# Patient Record
Sex: Male | Born: 1955 | Race: White | Hispanic: No | Marital: Single | Smoking: Former smoker
Health system: Southern US, Community
[De-identification: ages and names within clinical notes are randomized; demographics above are authoritative.]

## PROBLEM LIST (undated history)

## (undated) DIAGNOSIS — D751 Secondary polycythemia: Secondary | ICD-10-CM

## (undated) DIAGNOSIS — I2510 Atherosclerotic heart disease of native coronary artery without angina pectoris: Secondary | ICD-10-CM

## (undated) DIAGNOSIS — R7989 Other specified abnormal findings of blood chemistry: Secondary | ICD-10-CM

## (undated) DIAGNOSIS — E785 Hyperlipidemia, unspecified: Secondary | ICD-10-CM

## (undated) DIAGNOSIS — Z720 Tobacco use: Secondary | ICD-10-CM

## (undated) DIAGNOSIS — I255 Ischemic cardiomyopathy: Secondary | ICD-10-CM

## (undated) DIAGNOSIS — I5022 Chronic systolic (congestive) heart failure: Secondary | ICD-10-CM

## (undated) DIAGNOSIS — R945 Abnormal results of liver function studies: Secondary | ICD-10-CM

## (undated) DIAGNOSIS — I2111 ST elevation (STEMI) myocardial infarction involving right coronary artery: Secondary | ICD-10-CM

## (undated) HISTORY — PX: APPENDECTOMY: SHX54

## (undated) HISTORY — DX: Ischemic cardiomyopathy: I25.5

---

## 2015-02-27 ENCOUNTER — Emergency Department (HOSPITAL_COMMUNITY)
Admission: EM | Admit: 2015-02-27 | Discharge: 2015-02-27 | Disposition: A | Discharge status: Home / Self Care | Payer: Self-pay | Attending: Emergency Medicine | Admitting: Emergency Medicine

## 2015-02-27 ENCOUNTER — Encounter (HOSPITAL_COMMUNITY): Payer: Self-pay | Admitting: Family Medicine

## 2015-02-27 ENCOUNTER — Emergency Department (HOSPITAL_COMMUNITY): Payer: Self-pay

## 2015-02-27 DIAGNOSIS — Y9389 Activity, other specified: Secondary | ICD-10-CM | POA: Insufficient documentation

## 2015-02-27 DIAGNOSIS — Y998 Other external cause status: Secondary | ICD-10-CM | POA: Insufficient documentation

## 2015-02-27 DIAGNOSIS — S60222A Contusion of left hand, initial encounter: Secondary | ICD-10-CM | POA: Insufficient documentation

## 2015-02-27 DIAGNOSIS — Z720 Tobacco use: Secondary | ICD-10-CM | POA: Insufficient documentation

## 2015-02-27 DIAGNOSIS — Y92410 Unspecified street and highway as the place of occurrence of the external cause: Secondary | ICD-10-CM | POA: Insufficient documentation

## 2015-02-27 MED ORDER — NAPROXEN 500 MG PO TABS: 500.0000 mg | ORAL_TABLET | Freq: Two times a day (BID) | ORAL | Status: DC

## 2015-02-27 NOTE — Discharge Instructions (Signed)
Naprosyn for pain. Keep hand elevated. Ice several times a day. Continue ACE wrap for swelling and pain. Follow up with your doctor as needed.   Hand Contusion A hand contusion is a deep bruise on your hand area. Contusions are the result of an injury that caused bleeding under the skin. The contusion may turn blue, purple, or yellow. Minor injuries will give you a painless contusion, but more severe contusions may stay painful and swollen for a few weeks. CAUSES  A contusion is usually caused by a blow, trauma, or direct force to an area of the body. SYMPTOMS   Swelling and redness of the injured area.  Discoloration of the injured area.  Tenderness and soreness of the injured area.  Pain. DIAGNOSIS  The diagnosis can be made by taking a history and performing a physical exam. An X-ray, CT scan, or MRI may be needed to determine if there were any associated injuries, such as broken bones (fractures). TREATMENT  Often, the best treatment for a hand contusion is resting, elevating, icing, and applying cold compresses to the injured area. Over-the-counter medicines may also be recommended for pain control. HOME CARE INSTRUCTIONS   Put ice on the injured area.  Put ice in a plastic bag.  Place a towel between your skin and the bag.  Leave the ice on for 15-20 minutes, 03-04 times a day.  Only take over-the-counter or prescription medicines as directed by your caregiver. Your caregiver may recommend avoiding anti-inflammatory medicines (aspirin, ibuprofen, and naproxen) for 48 hours because these medicines may increase bruising.  If told, use an elastic wrap as directed. This can help reduce swelling. You may remove the wrap for sleeping, showering, and bathing. If your fingers become numb, cold, or blue, take the wrap off and reapply it more loosely.  Elevate your hand with pillows to reduce swelling.  Avoid overusing your hand if it is painful. SEEK IMMEDIATE MEDICAL CARE IF:    You have increased redness, swelling, or pain in your hand.  Your swelling or pain is not relieved with medicines.  You have loss of feeling in your hand or are unable to move your fingers.  Your hand turns cold or blue.  You have pain when you move your fingers.  Your hand becomes warm to the touch.  Your contusion does not improve in 2 days. MAKE SURE YOU:   Understand these instructions.  Will watch your condition.  Will get help right away if you are not doing well or get worse. Document Released: 01/11/2002 Document Revised: 04/15/2012 Document Reviewed: 01/13/2012 [facility] Patient Information 2015 [place], [place]. This information is not intended to replace advice given to you by your health care provider. Make sure you discuss any questions you have with your health care provider.
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
## 2015-07-05 ENCOUNTER — Encounter (HOSPITAL_COMMUNITY): Payer: Self-pay | Admitting: Neurology

## 2015-07-05 ENCOUNTER — Inpatient Hospital Stay (HOSPITAL_COMMUNITY): Payer: Self-pay

## 2015-07-05 ENCOUNTER — Inpatient Hospital Stay (HOSPITAL_COMMUNITY)
Admission: EM | Admit: 2015-07-05 | Discharge: 2015-07-08 | DRG: 247 | Disposition: A | Discharge status: Home / Self Care | Payer: Self-pay | Attending: Cardiovascular Disease | Admitting: Cardiovascular Disease

## 2015-07-05 ENCOUNTER — Encounter (HOSPITAL_COMMUNITY)
Admission: EM | Disposition: A | Discharge status: Home / Self Care | Payer: Self-pay | Source: Home / Self Care | Attending: Cardiovascular Disease

## 2015-07-05 ENCOUNTER — Emergency Department (HOSPITAL_COMMUNITY): Payer: MEDICAID

## 2015-07-05 DIAGNOSIS — Z955 Presence of coronary angioplasty implant and graft: Secondary | ICD-10-CM

## 2015-07-05 DIAGNOSIS — I25110 Atherosclerotic heart disease of native coronary artery with unstable angina pectoris: Secondary | ICD-10-CM | POA: Diagnosis present

## 2015-07-05 DIAGNOSIS — R7989 Other specified abnormal findings of blood chemistry: Secondary | ICD-10-CM

## 2015-07-05 DIAGNOSIS — E785 Hyperlipidemia, unspecified: Secondary | ICD-10-CM | POA: Diagnosis present

## 2015-07-05 DIAGNOSIS — Z720 Tobacco use: Secondary | ICD-10-CM

## 2015-07-05 DIAGNOSIS — I517 Cardiomegaly: Secondary | ICD-10-CM | POA: Diagnosis present

## 2015-07-05 DIAGNOSIS — I2111 ST elevation (STEMI) myocardial infarction involving right coronary artery: Principal | ICD-10-CM

## 2015-07-05 DIAGNOSIS — R945 Abnormal results of liver function studies: Secondary | ICD-10-CM

## 2015-07-05 DIAGNOSIS — F17210 Nicotine dependence, cigarettes, uncomplicated: Secondary | ICD-10-CM | POA: Diagnosis present

## 2015-07-05 DIAGNOSIS — D751 Secondary polycythemia: Secondary | ICD-10-CM | POA: Diagnosis present

## 2015-07-05 DIAGNOSIS — I213 ST elevation (STEMI) myocardial infarction of unspecified site: Secondary | ICD-10-CM

## 2015-07-05 DIAGNOSIS — I252 Old myocardial infarction: Secondary | ICD-10-CM | POA: Diagnosis present

## 2015-07-05 DIAGNOSIS — I2510 Atherosclerotic heart disease of native coronary artery without angina pectoris: Secondary | ICD-10-CM

## 2015-07-05 DIAGNOSIS — I255 Ischemic cardiomyopathy: Secondary | ICD-10-CM | POA: Diagnosis present

## 2015-07-05 HISTORY — DX: Abnormal results of liver function studies: R94.5

## 2015-07-05 HISTORY — DX: ST elevation (STEMI) myocardial infarction involving right coronary artery: I21.11

## 2015-07-05 HISTORY — DX: Other specified abnormal findings of blood chemistry: R79.89

## 2015-07-05 HISTORY — DX: Chronic systolic (congestive) heart failure: I50.22

## 2015-07-05 HISTORY — DX: Tobacco use: Z72.0

## 2015-07-05 HISTORY — DX: Secondary polycythemia: D75.1

## 2015-07-05 HISTORY — PX: CARDIAC CATHETERIZATION: SHX172

## 2015-07-05 HISTORY — DX: Hyperlipidemia, unspecified: E78.5

## 2015-07-05 HISTORY — DX: Atherosclerotic heart disease of native coronary artery without angina pectoris: I25.10

## 2015-07-05 LAB — BASIC METABOLIC PANEL
Anion gap: 13 (ref 5–15)
BUN: <5 mg/dL — ABNORMAL LOW (ref 6–20)
CHLORIDE: 100 mmol/L — AB (ref 101–111)
CO2: 25 mmol/L (ref 22–32)
CREATININE: 1.31 mg/dL — AB (ref 0.61–1.24)
Calcium: 9.2 mg/dL (ref 8.9–10.3)
GFR, EST NON AFRICAN AMERICAN: 57 mL/min — AB (ref >60)
Glucose, Bld: 187 mg/dL — ABNORMAL HIGH (ref 65–99)
Potassium: 3.8 mmol/L (ref 3.5–5.1)
SODIUM: 138 mmol/L (ref 135–145)

## 2015-07-05 LAB — CBC
HCT: 59.4 % — ABNORMAL HIGH (ref 39.0–52.0)
Hemoglobin: 20.5 g/dL — ABNORMAL HIGH (ref 13.0–17.0)
MCH: 31.3 pg (ref 26.0–34.0)
MCHC: 34.5 g/dL (ref 30.0–36.0)
MCV: 90.5 fL (ref 78.0–100.0)
PLATELETS: 256 10*3/uL (ref 150–400)
RBC: 6.56 MIL/uL — AB (ref 4.22–5.81)
RDW: 13.7 % (ref 11.5–15.5)
WBC: 14.7 10*3/uL — AB (ref 4.0–10.5)

## 2015-07-05 LAB — I-STAT CHEM 8, ED
BUN: 5 mg/dL — ABNORMAL LOW (ref 6–20)
Calcium, Ion: 1.07 mmol/L — ABNORMAL LOW (ref 1.13–1.30)
Chloride: 99 mmol/L — ABNORMAL LOW (ref 101–111)
Creatinine, Ser: 1.2 mg/dL (ref 0.61–1.24)
Glucose, Bld: 185 mg/dL — ABNORMAL HIGH (ref 65–99)
HEMATOCRIT: 63 % — AB (ref 39.0–52.0)
HEMOGLOBIN: 21.4 g/dL — AB (ref 13.0–17.0)
POTASSIUM: 3.7 mmol/L (ref 3.5–5.1)
SODIUM: 139 mmol/L (ref 135–145)
TCO2: 26 mmol/L (ref 0–100)

## 2015-07-05 LAB — I-STAT TROPONIN, ED: TROPONIN I, POC: 1.68 ng/mL — AB (ref 0.00–0.08)

## 2015-07-05 LAB — POCT ACTIVATED CLOTTING TIME: ACTIVATED CLOTTING TIME: 534 s

## 2015-07-05 LAB — TROPONIN I: TROPONIN I: 15.71 ng/mL — AB (ref <0.031)

## 2015-07-05 LAB — MRSA PCR SCREENING: MRSA BY PCR: NEGATIVE

## 2015-07-05 SURGERY — LEFT HEART CATH AND CORONARY ANGIOGRAPHY
Anesthesia: LOCAL

## 2015-07-05 MED ORDER — TICAGRELOR 90 MG PO TABS
ORAL_TABLET | ORAL | Status: DC | PRN
  Administered 2015-07-05: 180 mg via ORAL

## 2015-07-05 MED ORDER — TICAGRELOR 90 MG PO TABS
ORAL_TABLET | ORAL | Status: AC
  Filled 2015-07-05: qty 1

## 2015-07-05 MED ORDER — BIVALIRUDIN BOLUS VIA INFUSION - CUPID
INTRAVENOUS | Status: DC | PRN
  Administered 2015-07-05: 41.25 mg via INTRAVENOUS

## 2015-07-05 MED ORDER — SODIUM CHLORIDE 0.9 % IV SOLN: 1.7500 mg/kg/h | INTRAVENOUS | Status: DC

## 2015-07-05 MED ORDER — FUROSEMIDE 10 MG/ML IJ SOLN
INTRAMUSCULAR | Status: AC
  Filled 2015-07-05: qty 4

## 2015-07-05 MED ORDER — MIDAZOLAM HCL 2 MG/2ML IJ SOLN
INTRAMUSCULAR | Status: DC | PRN
  Administered 2015-07-05: 2 mg via INTRAVENOUS

## 2015-07-05 MED ORDER — HYDRALAZINE HCL 20 MG/ML IJ SOLN
INTRAMUSCULAR | Status: AC
  Filled 2015-07-05: qty 1

## 2015-07-05 MED ORDER — ASPIRIN 81 MG PO CHEW
324.0000 mg | CHEWABLE_TABLET | Freq: Once | ORAL | Status: AC
  Administered 2015-07-05: 324 mg via ORAL
  Filled 2015-07-05: qty 4

## 2015-07-05 MED ORDER — SODIUM CHLORIDE 0.9 % IV SOLN: 250.0000 mL | INTRAVENOUS | Status: DC | PRN

## 2015-07-05 MED ORDER — IOHEXOL 350 MG/ML SOLN
INTRAVENOUS | Status: DC | PRN
  Administered 2015-07-05: 185 mL via INTRA_ARTICULAR

## 2015-07-05 MED ORDER — BIVALIRUDIN 250 MG IV SOLR
INTRAVENOUS | Status: AC
  Filled 2015-07-05: qty 250

## 2015-07-05 MED ORDER — MORPHINE SULFATE (PF) 4 MG/ML IV SOLN
4.0000 mg | Freq: Once | INTRAVENOUS | Status: AC
  Administered 2015-07-05: 4 mg via INTRAVENOUS
  Filled 2015-07-05: qty 1

## 2015-07-05 MED ORDER — DOPAMINE-DEXTROSE 3.2-5 MG/ML-% IV SOLN: 3.0000 ug/kg/min | INTRAVENOUS | Status: DC

## 2015-07-05 MED ORDER — ONDANSETRON HCL 4 MG/2ML IJ SOLN: 4.0000 mg | Freq: Four times a day (QID) | INTRAMUSCULAR | Status: DC | PRN

## 2015-07-05 MED ORDER — FUROSEMIDE 10 MG/ML IJ SOLN
INTRAMUSCULAR | Status: DC | PRN
  Administered 2015-07-05: 20 mg via INTRAVENOUS

## 2015-07-05 MED ORDER — SODIUM CHLORIDE 0.9 % IV SOLN
1.7500 mg/kg/h | INTRAVENOUS | Status: AC
  Administered 2015-07-05: 1.75 mg/kg/h via INTRAVENOUS
  Filled 2015-07-05 (×2): qty 250

## 2015-07-05 MED ORDER — TICAGRELOR 90 MG PO TABS
90.0000 mg | ORAL_TABLET | Freq: Two times a day (BID) | ORAL | Status: DC
  Administered 2015-07-05 – 2015-07-08 (×6): 90 mg via ORAL
  Filled 2015-07-05 (×7): qty 1

## 2015-07-05 MED ORDER — LIDOCAINE HCL (PF) 1 % IJ SOLN
INTRAMUSCULAR | Status: AC
  Filled 2015-07-05: qty 30

## 2015-07-05 MED ORDER — LIDOCAINE HCL (PF) 1 % IJ SOLN
INTRAMUSCULAR | Status: DC | PRN
  Administered 2015-07-05: 15 mL

## 2015-07-05 MED ORDER — NITROGLYCERIN 1 MG/10 ML FOR IR/CATH LAB
INTRA_ARTERIAL | Status: AC
  Filled 2015-07-05: qty 10

## 2015-07-05 MED ORDER — FENTANYL CITRATE (PF) 100 MCG/2ML IJ SOLN
INTRAMUSCULAR | Status: DC | PRN
  Administered 2015-07-05: 50 ug via INTRAVENOUS

## 2015-07-05 MED ORDER — SODIUM CHLORIDE 0.9 % IV SOLN
INTRAVENOUS | Status: DC
  Administered 2015-07-05: 13:00:00 via INTRAVENOUS

## 2015-07-05 MED ORDER — NITROGLYCERIN IN D5W 200-5 MCG/ML-% IV SOLN
INTRAVENOUS | Status: AC
  Filled 2015-07-05: qty 250

## 2015-07-05 MED ORDER — HYDRALAZINE HCL 20 MG/ML IJ SOLN
10.0000 mg | Freq: Four times a day (QID) | INTRAMUSCULAR | Status: DC | PRN
  Administered 2015-07-05: 10 mg via INTRAVENOUS

## 2015-07-05 MED ORDER — ATROPINE SULFATE 0.1 MG/ML IJ SOLN
INTRAMUSCULAR | Status: AC
  Filled 2015-07-05: qty 10

## 2015-07-05 MED ORDER — HEPARIN SODIUM (PORCINE) 5000 UNIT/ML IJ SOLN
3500.0000 [IU] | INTRAMUSCULAR | Status: AC
  Administered 2015-07-05: 3500 [IU] via INTRAVENOUS
  Filled 2015-07-05: qty 1

## 2015-07-05 MED ORDER — MIDAZOLAM HCL 2 MG/2ML IJ SOLN
INTRAMUSCULAR | Status: AC
  Filled 2015-07-05: qty 2

## 2015-07-05 MED ORDER — SODIUM CHLORIDE 0.9 % IJ SOLN
3.0000 mL | Freq: Two times a day (BID) | INTRAMUSCULAR | Status: DC
  Administered 2015-07-06 – 2015-07-08 (×5): 3 mL via INTRAVENOUS

## 2015-07-05 MED ORDER — ASPIRIN EC 81 MG PO TBEC
81.0000 mg | DELAYED_RELEASE_TABLET | Freq: Every day | ORAL | Status: DC
Start: 2015-07-05 — End: 2015-07-08
  Administered 2015-07-06 – 2015-07-08 (×3): 81 mg via ORAL
  Filled 2015-07-05 (×3): qty 1

## 2015-07-05 MED ORDER — DOPAMINE-DEXTROSE 3.2-5 MG/ML-% IV SOLN
INTRAVENOUS | Status: AC
  Filled 2015-07-05: qty 250

## 2015-07-05 MED ORDER — ACETAMINOPHEN 325 MG PO TABS: 650.0000 mg | ORAL_TABLET | ORAL | Status: DC | PRN

## 2015-07-05 MED ORDER — NITROGLYCERIN IN D5W 200-5 MCG/ML-% IV SOLN
0.0000 ug/min | INTRAVENOUS | Status: DC
  Administered 2015-07-05: 30 ug/min via INTRAVENOUS

## 2015-07-05 MED ORDER — FENTANYL CITRATE (PF) 100 MCG/2ML IJ SOLN
INTRAMUSCULAR | Status: AC
  Filled 2015-07-05: qty 2

## 2015-07-05 MED ORDER — HEPARIN (PORCINE) IN NACL 2-0.9 UNIT/ML-% IJ SOLN
INTRAMUSCULAR | Status: AC
  Filled 2015-07-05: qty 1000

## 2015-07-05 MED ORDER — SODIUM CHLORIDE 0.9 % IV SOLN: INTRAVENOUS | Status: DC

## 2015-07-05 MED ORDER — NITROGLYCERIN 1 MG/10 ML FOR IR/CATH LAB
INTRA_ARTERIAL | Status: DC | PRN
  Administered 2015-07-05: 12:00:00

## 2015-07-05 MED ORDER — NITROGLYCERIN IN D5W 200-5 MCG/ML-% IV SOLN
INTRAVENOUS | Status: DC | PRN
  Administered 2015-07-05: 10 ug/min via INTRAVENOUS

## 2015-07-05 MED ORDER — SODIUM CHLORIDE 0.9 % IJ SOLN: 3.0000 mL | INTRAMUSCULAR | Status: DC | PRN

## 2015-07-05 MED ORDER — SODIUM CHLORIDE 0.9 % IV SOLN
250.0000 mg | INTRAVENOUS | Status: DC | PRN
  Administered 2015-07-05: 1.75 mg/kg/h via INTRAVENOUS

## 2015-07-05 MED ORDER — SODIUM CHLORIDE 0.9 % IV SOLN
INTRAVENOUS | Status: DC | PRN
  Administered 2015-07-05: 1000 mL via INTRAVENOUS

## 2015-07-05 SURGICAL SUPPLY — 17 items
BALLN EMERGE MR 2.0X12 (BALLOONS) ×2
BALLN ~~LOC~~ EUPHORA RX 3.25X20 (BALLOONS) ×2
BALLOON EMERGE MR 2.0X12 (BALLOONS) ×1 IMPLANT
BALLOON ~~LOC~~ EUPHORA RX 3.25X20 (BALLOONS) ×1 IMPLANT
CATH INFINITI 5FR MULTPACK ANG (CATHETERS) ×2 IMPLANT
GUIDE CATH RUNWAY 6FR FR4 (CATHETERS) ×2 IMPLANT
KIT ENCORE 26 ADVANTAGE (KITS) ×2 IMPLANT
KIT HEART LEFT (KITS) ×2 IMPLANT
PACK CARDIAC CATHETERIZATION (CUSTOM PROCEDURE TRAY) ×2 IMPLANT
SHEATH PINNACLE 6F 10CM (SHEATH) ×2 IMPLANT
STENT XIENCE ALPINE RX 3.0X38 (Permanent Stent) ×2 IMPLANT
SYR MEDRAD MARK V 150ML (SYRINGE) ×2 IMPLANT
TRANSDUCER W/STOPCOCK (MISCELLANEOUS) ×2 IMPLANT
TUBING CIL FLEX 10 FLL-RA (TUBING) ×2 IMPLANT
WIRE COUGAR XT STRL 190CM (WIRE) ×2 IMPLANT
WIRE EMERALD 3MM-J .035X150CM (WIRE) ×2 IMPLANT
WIRE PT2 MS 185 (WIRE) ×2 IMPLANT

## 2015-07-05 NOTE — Progress Notes (Signed)
Cardiac Cath Sheath Removal Note  Site Area: R Femoral  Site prior to removal: Level 0 Pressure applied for: 23 minutes  Began at: 18:42 to 19:00 Manual pressure  Patient status during sheath pull: vital signs stable Groin site post sheath removal: Level 0  Post sheath removal instructions given: YES  Post pulses present: YES  Pressure dressing applied: YES   Hillery Jacksebecca Micaela Stith, RN

## 2015-07-05 NOTE — ED Provider Notes (Signed)
CSN: [id number]     Arrival date & time 07/05/15  8469 History   First MD Initiated Contact with Patient 07/05/15 1004     Chief Complaint  Patient presents with  . Chest Pain     (Consider location/radiation/quality/duration/timing/severity/associated sxs/prior Treatment) HPI Comments: Patient presents to the emergency department with chief complaint of chest pain. He states that he has had intermittent chest pain 3 days. He states that the pain is in his central chest. Contrary to nursing note, the pain comes and goes. CP this morning started at around 7:30 am. He reports associated chills, but denies any fevers. He states that his symptoms are worsened with exertion.  He denies any history of prior heart problems.  States that he is followed at the [place].  He is uncertain about whether he has HTN or HL.  Denies DM.  Reports that he is an everyday smoker.  The history is provided by the patient. No language interpreter was used.    History reviewed. No pertinent past medical history. Past Surgical History  Procedure Laterality Date  . Appendectomy     No family history on file. Social History  Substance Use Topics  . Smoking status: Current Every Day Smoker  . Smokeless tobacco: None  . Alcohol Use: No    Review of Systems  Constitutional: Negative for fever and chills.  Respiratory: Negative for shortness of breath.   Cardiovascular: Positive for chest pain.  Gastrointestinal: Negative for nausea, vomiting, diarrhea and constipation.  Genitourinary: Negative for dysuria.  All other systems reviewed and are negative.     Allergies  Review of patient's allergies indicates no known allergies.  Home Medications   Prior to Admission medications   Medication Sig Start Date End Date Taking? Authorizing Provider  Phenyleph-Doxylamine-DM-APAP (ALKA-SELTZER PLS NIGHT CLD/FLU PO) Take 1 packet by mouth at bedtime.   Yes Historical Provider, MD  naproxen (NAPROSYN) 500 MG tablet  Take 1 tablet (500 mg total) by mouth 2 (two) times daily. Patient not taking: Reported on 07/05/2015 02/27/15   Tatyana Kirichenko, PA-C   BP 175/107 mmHg  Pulse 88  Temp(Src) 97.5 F (36.4 C) (Oral)  Resp 18  SpO2 98% Physical Exam  Constitutional: He is oriented to person, place, and time. He appears well-developed and well-nourished.  HENT:  Head: Normocephalic and atraumatic.  Eyes: Conjunctivae and EOM are normal. Pupils are equal, round, and reactive to light. Right eye exhibits no discharge. Left eye exhibits no discharge. No scleral icterus.  Neck: Normal range of motion. Neck supple. No JVD present.  Cardiovascular: Normal rate, regular rhythm and normal heart sounds.  Exam reveals no gallop and no friction rub.   No murmur heard. Pulmonary/Chest: Effort normal and breath sounds normal. No respiratory distress. He has no wheezes. He has no rales. He exhibits no tenderness.  Abdominal: Soft. He exhibits no distension and no mass. There is no tenderness. There is no rebound and no guarding.  Musculoskeletal: Normal range of motion. He exhibits no edema or tenderness.  Neurological: He is alert and oriented to person, place, and time.  Skin: Skin is warm and dry.  Psychiatric: He has a normal mood and affect. His behavior is normal. Judgment and thought content normal.  Nursing note and vitals reviewed.   ED Course  Procedures (including critical care time) Results for orders placed or performed during the hospital encounter of 07/05/15  CBC  Result Value Ref Range   WBC 14.7 (H) 4.0 - 10.5 K/uL  RBC 6.56 (H) 4.22 - 5.81 MIL/uL   Hemoglobin 20.5 (H) 13.0 - 17.0 g/dL   HCT 40.959.4 (H) 81.139.0 - 91.452.0 %   MCV 90.5 78.0 - 100.0 fL   MCH 31.3 26.0 - 34.0 pg   MCHC 34.5 30.0 - 36.0 g/dL   RDW 78.213.7 95.611.5 - 21.315.5 %   Platelets 256 150 - 400 K/uL  I-stat troponin, ED (not [facility])  Result Value Ref Range   Troponin i, poc 1.68 (HH) 0.00 - 0.08 ng/mL   Comment NOTIFIED PHYSICIAN     Comment 3          I-stat chem 8, ed  Result Value Ref Range   Sodium 139 135 - 145 mmol/L   Potassium 3.7 3.5 - 5.1 mmol/L   Chloride 99 (L) 101 - 111 mmol/L   BUN 5 (L) 6 - 20 mg/dL   Creatinine, Ser 0.861.20 0.61 - 1.24 mg/dL   Glucose, Bld 578185 (H) 65 - 99 mg/dL   Calcium, Ion 4.691.07 (L) 1.13 - 1.30 mmol/L   TCO2 26 0 - 100 mmol/L   Hemoglobin 21.4 (HH) 13.0 - 17.0 g/dL   HCT 62.963.0 (H) 52.839.0 - 41.352.0 %   Comment NOTIFIED PHYSICIAN    No results found.  I have personally reviewed and evaluated these images and lab results as part of my medical decision-making.   EKG Interpretation   Date/Time:  Wednesday July 05 2015 10:30:11 EST Ventricular Rate:  97 PR Interval:  116 QRS Duration: 103 QT Interval:  377 QTC Calculation: 479 R Axis:   -90 Text Interpretation:  Sinus rhythm Multiple premature complexes, vent &  supraven Borderline short PR interval Probable left atrial enlargement  Inferior infarct, acute (RCA) Anterior infarct, old ** ** ACUTE MI / STEMI  ** ** Confirmed by PICKERING  MD, NATHAN [phone]([id number]) on 07/05/2015 10:41:09 AM      MDM   Final diagnoses:  ST elevation myocardial infarction (STEMI), unspecified artery (HCC)    Initial EKG shows possible arm lead reversal.  Will get repeat.   10:36 AM Repeat EKG shows STEMI. With ST elevation inferiorly and depressions in AVL.  Patient immediately seen by Dr. Rubin PayorPickering, code STEMI activated.  10:40 AM Dr. Rubin PayorPickering discussed patient with STEMI team, who will take the patient to cath.  Patient given aspirin, and heparin prior to leaving ED for cath.  Troponin is 1.68.    CRITICAL CARE Performed by: Roxy HorsemanBROWNING, Hailey Stormer   Total critical care time: 30 minutes  Critical care time was exclusive of separately billable procedures and treating other patients.  Critical care was necessary to treat or prevent imminent or life-threatening deterioration.  Critical care was time spent personally by me on the following  activities: development of treatment plan with patient and/or surrogate as well as nursing, discussions with consultants, evaluation of patient's response to treatment, examination of patient, obtaining history from patient or surrogate, ordering and performing treatments and interventions, ordering and review of laboratory studies, ordering and review of radiographic studies, pulse oximetry and re-evaluation of patient's condition.    Roxy Horsemanobert Genie Wenke, PA-C 07/05/15 1059  Benjiman CoreNathan Pickering, MD 07/06/15 [phone]

## 2015-07-05 NOTE — Care Management Note (Addendum)
Case Management Note  Patient Details  Name: Aleda GranaBilly Schulke MRN: [id number] Date of Birth: 04/09/1956  Subjective/Objective:   Adm w mi                 Action/Plan: lives w wife   Expected Discharge Date:                  Expected Discharge Plan:     In-House Referral:     Discharge planning Services     Post Acute Care Choice:    Choice offered to:     DME Arranged:    DME Agency:     HH Arranged:    HH Agency:     Status of Service:     Medicare Important Message Given:    Date Medicare IM Given:    Medicare IM give by:    Date Additional Medicare IM Given:    Additional Medicare Important Message give by:     If discussed at Long Length of Stay Meetings, dates discussed:     ur review done,chart states goes to [facility] care. Left pt inform on [facility] and [facility]. Left pt brilinta 30day free card. Placed brilinta pt assist form on shadow chart.  Hanley Haysowell, Francesa Eugenio T, RN 07/05/2015, 1:38 PM
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
## 2015-07-05 NOTE — ED Notes (Signed)
Pt reports cp for 3 days. Describes pain as behind his sternum and is constant. Also genaralized body aches for 3 days. Denies n/v/d.

## 2015-07-06 ENCOUNTER — Encounter (HOSPITAL_COMMUNITY): Payer: Self-pay | Admitting: Cardiovascular Disease

## 2015-07-06 ENCOUNTER — Inpatient Hospital Stay (HOSPITAL_COMMUNITY): Payer: Self-pay

## 2015-07-06 DIAGNOSIS — I2510 Atherosclerotic heart disease of native coronary artery without angina pectoris: Secondary | ICD-10-CM

## 2015-07-06 DIAGNOSIS — I25110 Atherosclerotic heart disease of native coronary artery with unstable angina pectoris: Secondary | ICD-10-CM

## 2015-07-06 DIAGNOSIS — Z955 Presence of coronary angioplasty implant and graft: Secondary | ICD-10-CM

## 2015-07-06 HISTORY — DX: Atherosclerotic heart disease of native coronary artery without angina pectoris: I25.10

## 2015-07-06 LAB — CBC
HCT: 55.3 % — ABNORMAL HIGH (ref 39.0–52.0)
HEMOGLOBIN: 19 g/dL — AB (ref 13.0–17.0)
MCH: 30.7 pg (ref 26.0–34.0)
MCHC: 34.4 g/dL (ref 30.0–36.0)
MCV: 89.5 fL (ref 78.0–100.0)
PLATELETS: 212 10*3/uL (ref 150–400)
RBC: 6.18 MIL/uL — ABNORMAL HIGH (ref 4.22–5.81)
RDW: 14 % (ref 11.5–15.5)
WBC: 19.9 10*3/uL — ABNORMAL HIGH (ref 4.0–10.5)

## 2015-07-06 LAB — HEPATIC FUNCTION PANEL
ALBUMIN: 3 g/dL — AB (ref 3.5–5.0)
ALT: 27 U/L (ref 17–63)
AST: 111 U/L — ABNORMAL HIGH (ref 15–41)
Alkaline Phosphatase: 56 U/L (ref 38–126)
BILIRUBIN DIRECT: 0.2 mg/dL (ref 0.1–0.5)
BILIRUBIN INDIRECT: 1.9 mg/dL — AB (ref 0.3–0.9)
BILIRUBIN TOTAL: 2.1 mg/dL — AB (ref 0.3–1.2)
Total Protein: 5.5 g/dL — ABNORMAL LOW (ref 6.5–8.1)

## 2015-07-06 LAB — TROPONIN I
TROPONIN I: 26.15 ng/mL — AB (ref <0.031)
TROPONIN I: 22.2 ng/mL — AB (ref <0.031)
Troponin I: 17.56 ng/mL (ref <0.031)

## 2015-07-06 LAB — BASIC METABOLIC PANEL
Anion gap: 14 (ref 5–15)
BUN: 6 mg/dL (ref 6–20)
CALCIUM: 9 mg/dL (ref 8.9–10.3)
CO2: 21 mmol/L — ABNORMAL LOW (ref 22–32)
CREATININE: 0.97 mg/dL (ref 0.61–1.24)
Chloride: 104 mmol/L (ref 101–111)
GFR calc Af Amer: >60 mL/min (ref >60)
GLUCOSE: 82 mg/dL (ref 65–99)
POTASSIUM: 4.4 mmol/L (ref 3.5–5.1)
SODIUM: 139 mmol/L (ref 135–145)

## 2015-07-06 MED ORDER — PNEUMOCOCCAL VAC POLYVALENT 25 MCG/0.5ML IJ INJ
0.5000 mL | INJECTION | INTRAMUSCULAR | Status: AC
  Administered 2015-07-07: 0.5 mL via INTRAMUSCULAR
  Filled 2015-07-06: qty 0.5

## 2015-07-06 MED ORDER — INFLUENZA VAC SPLIT QUAD 0.5 ML IM SUSY
0.5000 mL | PREFILLED_SYRINGE | INTRAMUSCULAR | Status: AC
  Administered 2015-07-07: 0.5 mL via INTRAMUSCULAR
  Filled 2015-07-06: qty 0.5

## 2015-07-06 MED ORDER — ATORVASTATIN CALCIUM 80 MG PO TABS
80.0000 mg | ORAL_TABLET | Freq: Every day | ORAL | Status: DC
  Administered 2015-07-06 – 2015-07-07 (×2): 80 mg via ORAL
  Filled 2015-07-06 (×2): qty 1

## 2015-07-06 MED ORDER — CARVEDILOL 3.125 MG PO TABS
3.1250 mg | ORAL_TABLET | Freq: Two times a day (BID) | ORAL | Status: DC
  Administered 2015-07-06 – 2015-07-08 (×5): 3.125 mg via ORAL
  Filled 2015-07-06 (×5): qty 1

## 2015-07-06 NOTE — Progress Notes (Signed)
*  PRELIMINARY RESULTS* Echocardiogram 2D Echocardiogram has been performed.  Jeryl Columbialliott, Jontavia Leatherbury 07/06/2015, 11:31 AM

## 2015-07-06 NOTE — Progress Notes (Addendum)
59 y/o man with PMH of long term smoking (no other history b/c lack of medical care) who presented to [facility] ED with Inferior STEMI. Taken for Emergent Cardiac Cath -- 99% dRCA with 100% RPAV -- unable to wire RPAV, stented dRCA into RPDA.  Also noted severe pLAD lesion (with possible Cx lesion) & severe global hypokinesis,EF ~25% on LV Gram.   Subjective:  No further CP or dyspnea - lying in bed. No complaints.  Objective:  Vital Signs in the last 24 hours: Temp:  [97.4 F (36.3 C)-98 F (36.7 C)] 97.8 F (36.6 C) (12/01 0400) Pulse Rate:  [0-123] 104 (12/01 0900) Resp:  [0-31] 7 (12/01 0600) BP: (61-176)/(45-122) 114/84 mmHg (12/01 0900) SpO2:  [0 %-100 %] 96 % (12/01 0600) Arterial Line BP: (63-149)/(43-101) 123/77 mmHg (11/30 1832) Weight:  [122 lb 5.7 oz (55.5 kg)] 122 lb 5.7 oz (55.5 kg) (11/30 1230)  Intake/Output from previous day: 11/30 0701 - 12/01 0700 In: 1640.4 [P.O.:480; I.V.:1160.4] Out: 1175 [Urine:1175] Intake/Output from this shift: Total I/O In: 3 [I.V.:3] Out: -   Physical Exam: General: elderly Caucasian male, resting in bed, NAD HEENT: PERRL, EOMI, no scleral icterus, oropharynx clear Cardiac: RRR, no rubs, murmurs or gallops Pulm: CTAB, no wheezes, rales, or rhonchi Abd: soft, nontender, nondistended, BS present Ext: warm and well perfused, no pedal or tibial edema; R groin cath site stable Neuro: responds to questions appropriately; moving all extremities freely  Lab Results:  Recent Labs  07/05/15 1012 07/05/15 1042 07/06/15 0250  WBC 14.7*  --  19.9*  HGB 20.5* 21.4* 19.0*  PLT 256  --  212    Recent Labs  07/05/15 1012 07/05/15 1042 07/06/15 0250  NA 138 139 139  K 3.8 3.7 4.4  CL 100* 99* 104  CO2 25  --  21*  GLUCOSE 187* 185* 82  BUN <5* 5* 6  CREATININE 1.31* 1.20 0.97    Recent Labs  07/05/15 1340 07/06/15 0835  TROPONINI 15.71* 26.15*   Hepatic Function Panel No results for input(s): PROT, ALBUMIN, AST, ALT,  ALKPHOS, BILITOT, BILIDIR, IBILI in the last 72 hours. No results for input(s): CHOL in the last 72 hours. No results for input(s): PROTIME in the last 72 hours.  Imaging: Imaging results have been reviewed  Cardiac Studies:  Cath 11/30: Conclusion     Ost LAD to Prox LAD lesion, 70% stenosed.  Post Atrio lesion, 100% stenosed. - unable to open.  Dist RCA-1 lesion, 50% stenosed.  Dist RCA-2 lesion, 99% stenosed. Post intervention, there is a 0% residual stenosis.  RPDA lesion, 50% stenosed. Post intervention, there is a 0% residual stenosis.  There is severe left ventricular systolic dysfunction.  Dilated left ventricle with severe global LV dysfunction and an ejection fraction of less than 20%.  Two vessel coronary obstructive disease with 70% smooth tubular eccentric ostial LAD stenosis; normal left circumflex: Artery, and RCA with diffuse distal disease with 50, 99, 50% stenoses with total occlusion of the PLA branch.  Successful PCI to the distal RCA with PTCA/DES stenting with a 3.038 mm Xience Alpine stent postdilated to 3.24 down to 3.14 mm with the entire region of stenoses being reduced to 0%. At the completion of the procedure there was am TIMI 1 flow down the PLA branch. There was brisk TIMI-3 flow down the RCA.  RECOMMENDATION: The patient will be maintained on dual antiplatelet therapy. He will need to be started on the patient, probable aldosterone blockade, carvedilol for his  severe LV dysfunction which is out of proportion to his RCA acute infarct. Angiograms will be reviewed with colleagues for consideration of ostial LAD PCI. Smoking cessation was strongly advised.    Assessment/Plan:  Principal Problem:   ST elevation myocardial infarction involving right coronary artery [facility]) Active Problems:   Atherosclerotic heart disease of native coronary artery with unstable angina pectoris (HCC)   Presence of drug coated stent in right coronary artery  The  patient has severe multivessel disease now status post PCI to the RCA. Unable to cannulate the posterior AV groove vessel, therefore would expect completed infarct in that distribution. Clinically he does not have any additional anginal symptoms. Lipid panel pending, blood pressure was initially elevated post catheterization, but now normal. Was hypotensive initially.  PLAN:  Aspirin plus Brilinta.  Check fasting lipid panel, start Lipitor 80 mg    start low-dose beta blocker: Coreg to 25 twice a day  2-D echocardiogram ordered: Need to determine extent of reduced ejection fraction. If significant elevated, may need to consider LifeVest -- this would also add to importance of complete revascularization.  I reviewed the cardiac catheterization images personally, I have asked the couple other colleagues to review them as well. The LAD lesion does look significant & may indeed require intervention, especially in light of ischemic cardiomyopathy -- I do think that the cardiomyopathy is well out of proportion with his CAD.  At this point, I would prefer to allow him to recover from initial MI. We can get him started on some cardio mapping medications starting with a beta blocker now. Any on his symptoms while he is inpatient (clearly not a fast track discharge), we can consider whether or not needs to be completely revascularized during this hospitalization versus evaluation with Myoview in OP setting once stable post MI.     LOS: 1 day    HARDING, DAVID W 07/06/2015, 10:36 AM

## 2015-07-06 NOTE — Progress Notes (Signed)
CARDIAC REHAB PHASE I   PRE:  Rate/Rhythm: 93 SR  BP:  Supine: 115/72  Sitting:   Standing:    SaO2:   MODE:  Ambulation: 350 ft   POST:  Rate/Rhythm: 107 ST  BP:  Supine:   Sitting: 126/77  Standing:    SaO2: 96%RA 1030-1150 Pt walked 350 ft on RA with steady gait. Tolerated well. No CP. MI education completed with pt and family who voiced understanding. Stressed importance of brilinta with stent. Reviewed MI restrictions, NTG use, heart healthy diet, smoking cessation and gave handout and fake cigarette. Pt does not have a plan right now. Encouraged him to think about it. Discussed ex ed and CRP 2. Will refer to [place]. Pt will need CHF ed if EF low by Echo. We will follow up tomorrow.   Jason Nuttingharlene Rayquon Uselman, RN BSN  07/06/2015 11:44 AM

## 2015-07-07 ENCOUNTER — Encounter (HOSPITAL_COMMUNITY): Payer: Self-pay | Admitting: Cardiology

## 2015-07-07 DIAGNOSIS — E785 Hyperlipidemia, unspecified: Secondary | ICD-10-CM | POA: Diagnosis present

## 2015-07-07 DIAGNOSIS — Z955 Presence of coronary angioplasty implant and graft: Secondary | ICD-10-CM

## 2015-07-07 DIAGNOSIS — I255 Ischemic cardiomyopathy: Secondary | ICD-10-CM | POA: Diagnosis present

## 2015-07-07 HISTORY — PX: TRANSTHORACIC ECHOCARDIOGRAM: SHX275

## 2015-07-07 LAB — CBC
HCT: 50.8 % (ref 39.0–52.0)
Hemoglobin: 17.1 g/dL — ABNORMAL HIGH (ref 13.0–17.0)
MCH: 30.6 pg (ref 26.0–34.0)
MCHC: 33.7 g/dL (ref 30.0–36.0)
MCV: 91 fL (ref 78.0–100.0)
PLATELETS: 194 10*3/uL (ref 150–400)
RBC: 5.58 MIL/uL (ref 4.22–5.81)
RDW: 13.8 % (ref 11.5–15.5)
WBC: 11.3 10*3/uL — ABNORMAL HIGH (ref 4.0–10.5)

## 2015-07-07 LAB — LIPID PANEL
CHOL/HDL RATIO: 5.8 ratio
CHOLESTEROL: 221 mg/dL — AB (ref 0–200)
HDL: 38 mg/dL — ABNORMAL LOW (ref >40)
LDL CALC: 160 mg/dL — AB (ref 0–99)
Triglycerides: 117 mg/dL (ref <150)
VLDL: 23 mg/dL (ref 0–40)

## 2015-07-07 NOTE — Progress Notes (Signed)
59 y/o man with PMH of long term smoking (no other history b/c lack of medical care) who [facility] with Inferior STEMI. Taken for Emergent Cardiac Cath -- 99% dRCA with 100% RPAV -- unable to wire RPAV, stented dRCA into RPDA.  Also noted severe pLAD lesion (with possible Cx lesion) & severe global hypokinesis,EF ~25% on LV Gram.   Subjective:  No further CP or dyspnea - lying in bed. No complaints.  Objective:  Vital Signs in the last 24 hours: Temp:  [97.9 F (36.6 C)-98.9 F (37.2 C)] 98.1 F (36.7 C) (12/02 0724) Pulse Rate:  [85-93] 85 (12/02 0724) Resp:  [16-23] 16 (12/02 0724) BP: (95-125)/(61-81) 111/76 mmHg (12/02 0724) SpO2:  [94 %-96 %] 96 % (12/02 0724)  Intake/Output from previous day: 12/01 0701 - 12/02 0700 In: 1388 [P.O.:935; I.V.:453] Out: -  Intake/Output from this shift: Total I/O In: 240 [P.O.:240] Out: -   Physical Exam: General: elderly Caucasian male, resting in bed, NAD HEENT: PERRL, EOMI, no scleral icterus, oropharynx clear Cardiac: RRR, no rubs, murmurs or gallops Pulm: CTAB, no wheezes, rales, or rhonchi Abd: soft, nontender, nondistended, BS present Ext: warm and well perfused, no pedal or tibial edema; R groin cath site stable Neuro: responds to questions appropriately; moving all extremities freely  Lab Results:  Recent Labs  07/06/15 0250 07/07/15 0332  WBC 19.9* 11.3*  HGB 19.0* 17.1*  PLT 212 194    Recent Labs  07/05/15 1012 07/05/15 1042 07/06/15 0250  NA 138 139 139  K 3.8 3.7 4.4  CL 100* 99* 104  CO2 25  --  21*  GLUCOSE 187* 185* 82  BUN <5* 5* 6  CREATININE 1.31* 1.20 0.97    Recent Labs  07/06/15 1424 07/06/15 2015  TROPONINI 22.20* 17.56*   Hepatic Function Panel  Recent Labs  07/06/15 0250  PROT 5.5*  ALBUMIN 3.0*  AST 111*  ALT 27  ALKPHOS 56  BILITOT 2.1*  BILIDIR 0.2  IBILI 1.9*    Recent Labs  07/07/15 0332  CHOL 221*   Lab Results  Component Value Date   CHOL 221*  07/07/2015   HDL 38* 07/07/2015   LDLCALC 160* 07/07/2015   TRIG 117 07/07/2015   CHOLHDL 5.8 07/07/2015    No results for input(s): PROTIME in the last 72 hours.  Imaging: Imaging results have been reviewed  Cardiac Studies:  Cath 11/30: Conclusion     Ost LAD to Prox LAD lesion, 70% stenosed.  Post Atrio lesion, 100% stenosed. - unable to open.  Dist RCA-1 lesion, 50% stenosed.  Dist RCA-2 lesion, 99% stenosed. Post intervention, there is a 0% residual stenosis.  RPDA lesion, 50% stenosed. Post intervention, there is a 0% residual stenosis.  There is severe left ventricular systolic dysfunction.  Dilated left ventricle with severe global LV dysfunction and an ejection fraction of less than 20%.  Two vessel coronary obstructive disease with 70% smooth tubular eccentric ostial LAD stenosis; normal left circumflex: Artery, and RCA with diffuse distal disease with 50, 99, 50% stenoses with total occlusion of the PLA branch.  Successful PCI to the distal RCA with PTCA/DES stenting with a 3.038 mm Xience Alpine stent postdilated to 3.24 down to 3.14 mm with the entire region of stenoses being reduced to 0%. At the completion of the procedure there was am TIMI 1 flow down the PLA branch. There was brisk TIMI-3 flow down the RCA.  RECOMMENDATION: The patient will be maintained on dual  antiplatelet therapy. He will need to be started on the patient, probable aldosterone blockade, carvedilol for his severe LV dysfunction which is out of proportion to his RCA acute infarct. Angiograms will be reviewed with colleagues for consideration of ostial LAD PCI. Smoking cessation was strongly advised.    2D Echo 12/1: Impressions: - Normal LV size with EF 30%. Diffuse hypokinesis with inferior akinesis. Normal RV size and systolic function. No significant valvular abnormalities.  Assessment/Plan:  Principal Problem:   ST elevation myocardial infarction involving right coronary  artery (HCC) Active Problems:   Atherosclerotic heart disease of native coronary artery with unstable angina pectoris (HCC)   Presence of drug coated stent in right coronary artery   Cardiomyopathy, ischemic; EF 30% (out of proportion to CAD)   Hyperlipidemia with target LDL less than 70; Poorly controlled  The patient has severe multivessel disease now status post PCI to the RCA. Unable to cannulate the posterior AV groove vessel, therefore would expect completed infarct in that distribution. Clinically he does not have any additional anginal symptoms.  PLAN:  Aspirin plus Brilinta for ~1 yr DAPT  Poor Lipid control --> started Lipitor 80 mg    Tolerating Coreg to 3.125 twice a day - but BP still is not high enough to uptitrate or add afterload reduction.  2-D echocardiogram with EF ~30% -- will discuss with EP --> may need to consider LifeVest;   Currently no SSx of CHF - consider low dose PO diuretic prior to d/c to avoid CHF complications (would benefit from Aldactone). => would also consider Entresto over ARB if BP will allow (doubt that we can start afterload reduction prior to d/c).  I reviewed the cardiac catheterization images personally, I have asked the couple other colleagues to review them as well. The LAD lesion does look significant & may indeed require intervention, especially in light of ischemic cardiomyopathy; however, we all agree that his cardiomyopathy is well out of proportion with his CAD.    At this point, I would prefer to allow him to recover from initial MI - optimize medical management with BB (+/- ACE-I/ARB if BP will allow - currently BP not high enough to start either).  Clearly not a fast track discharge with reduced LVEF, provided no further angina or significant CHF Sx - would plan to progress from CCU today to Tele since he has not had anginal or CHF Sx & no arrhythmia on tele.    If stable, would probably consider discharge tomorrow. He has not had any  active symptoms. I don't think that he would warrant LifeVest. I've discussed it with other interventional colleagues as well as watch physiology doctor. In the absence of significant arrhythmias or heart failure, no clear data for the use of a LifeVest.  --Transfer to Tele today - expect d/c in AM     LOS: 2 days    HARDING, DAVID W 07/07/2015, 9:09 AM

## 2015-07-07 NOTE — Progress Notes (Signed)
Jason Barry is a 59 y.o. male patient. 1. ST elevation myocardial infarction (STEMI), unspecified artery (HCC)      No Known Allergies  Blood pressure 111/76, pulse 85, temperature 98.1 F (36.7 C), temperature source Oral, resp. rate 16, height 5\' 2"  (1.575 m), weight 55.5 kg (122 lb 5.7 oz), SpO2 96 %.  Subjective No issues over night. Patient had no complaints of pain. Denies CP or discomfort. Has made multiple long laps around unit overnight and today. No SOB. Tolerates walks very well. Ambulated independently.  Objective Patient pleasat, appears comfortable. Assessment benign.   Assessment & Plan Assessment unremarkable. Will continue to encourage activity. Flu and pneumonia vaccines given. Will continue to encourage smoking cessation.Transfere to tele today per MD orders.   Rise PaganiniCURRY, Jason Ashland R 07/07/2015

## 2015-07-07 NOTE — Care Management Note (Addendum)
Case Management Note  Patient Details  Name: Jason Barry MRN: [id number] Date of Birth: 02/21/1956  Subjective/Objective:    Inferior STEMI          Action/Plan:  NCM spoke to pt and wife, Jason Barry at bedside. [facility]. [phone]. Provided pt with Brilinta 30 day free trial card. [facility]. Pt will be able to get his medications from the [facility] once he has established with a PCP.  Pt states he works full-time and plans to apply for his social security when he turns 59 yrs old.  Contacted [facility] and pt does not have a PCP in the system. They will assign him a PCP. He is registered at this [facility] for service.   Expected Discharge Date:  07/09/2015               Expected Discharge Plan:  Home/Self Care  In-House Referral:     Discharge planning Services  CM Consult   Status of Service:  Completed, signed off  Medicare Important Message Given:    Date Medicare IM Given:    Medicare IM give by:    Date Additional Medicare IM Given:    Additional Medicare Important Message give by:     If discussed at Long Length of Stay Meetings, dates discussed:    Additional Comments:  Jason Barry, Jason Kozuch Ellen, RN 07/07/2015, 1:08 PM

## 2015-07-07 NOTE — Progress Notes (Signed)
Transfer from 2heart to floor, oriented to room, call light and phone within reach, denies pain or discomfort.  Raymon MuttonGwen Adaley Kiene RN

## 2015-07-07 NOTE — Progress Notes (Signed)
Pt has been walking independently all day. No SOB or sx. Feels well. Discussed low EF, daily wts, low sodium. Pt voiced understanding. Reiterated need for him to get Brilinta filled. He sts his wife has the small discount card. Pt will need teach back tomorrow. [phone] Ethelda ChickKristan Elisha Cooksey CES, ACSM 3:33 PM 07/07/2015

## 2015-07-08 ENCOUNTER — Encounter (HOSPITAL_COMMUNITY): Payer: Self-pay | Admitting: Physician Assistant

## 2015-07-08 DIAGNOSIS — D751 Secondary polycythemia: Secondary | ICD-10-CM | POA: Diagnosis present

## 2015-07-08 DIAGNOSIS — R7989 Other specified abnormal findings of blood chemistry: Secondary | ICD-10-CM

## 2015-07-08 DIAGNOSIS — Z720 Tobacco use: Secondary | ICD-10-CM

## 2015-07-08 DIAGNOSIS — I5022 Chronic systolic (congestive) heart failure: Secondary | ICD-10-CM | POA: Insufficient documentation

## 2015-07-08 DIAGNOSIS — R945 Abnormal results of liver function studies: Secondary | ICD-10-CM

## 2015-07-08 MED ORDER — ATORVASTATIN CALCIUM 80 MG PO TABS: 80.0000 mg | ORAL_TABLET | Freq: Every evening | ORAL | Status: DC

## 2015-07-08 MED ORDER — NITROGLYCERIN 0.4 MG SL SUBL
0.4000 mg | SUBLINGUAL_TABLET | SUBLINGUAL | Status: DC | PRN
Start: 2015-07-08 — End: 2015-07-19

## 2015-07-08 MED ORDER — ASPIRIN 81 MG PO TBEC: 81.0000 mg | DELAYED_RELEASE_TABLET | Freq: Every day | ORAL | Status: AC

## 2015-07-08 MED ORDER — TICAGRELOR 90 MG PO TABS: 90.0000 mg | ORAL_TABLET | Freq: Two times a day (BID) | ORAL | Status: DC

## 2015-07-08 MED ORDER — LIVING BETTER WITH HEART FAILURE BOOK
Freq: Once | Status: DC
Start: 2015-07-08 — End: 2015-07-08

## 2015-07-08 MED ORDER — CARVEDILOL 3.125 MG PO TABS: 3.1250 mg | ORAL_TABLET | Freq: Two times a day (BID) | ORAL | Status: DC

## 2015-07-08 NOTE — Progress Notes (Signed)
0945 Pt walking independently with steady gait. No CP. Pt able to answer all teachback re CHF ed and NTG use. Will sign off. No other questions per pt. Luetta NuttingCharlene Tawny Raspberry RN BSN 07/08/2015 9:52 AM

## 2015-07-08 NOTE — Progress Notes (Addendum)
Note signed in error. See dc summary.

## 2015-07-08 NOTE — Discharge Instructions (Signed)
Acute Coronary Syndrome °Acute coronary syndrome (ACS) is a serious problem in which there is suddenly not enough blood and oxygen supplied to the heart. ACS may mean that one or more of the blood vessels in your heart (coronary arteries) may be blocked. ACS can result in chest pain or a heart attack (myocardial infarction or MI). °CAUSES °This condition is caused by atherosclerosis, which is the buildup of fat and cholesterol (plaque) on the inside of the arteries. Over time, the plaque may narrow or block the artery, and this will lessen blood flow to the heart. Plaque can also become weak and break off within a coronary artery to form a clot and cause a sudden blockage. °RISK FACTORS °The risks factors of this condition include: °· High cholesterol levels. °· High blood pressure (hypertension). °· Smoking. °· Diabetes. °· Age. °· Family history of chest pain, heart disease, or stroke. °· Lack of exercise. °SYMPTOMS °The most common signs of this condition include: °· Chest pain, which can be: °· A crushing or squeezing in the chest. °· A tightness, pressure, fullness, or heaviness in the chest. °· Present for more than a few minutes, or it can stop and recur. °· Pain in the arms, neck, jaw, or back. °· Unexplained heartburn or indigestion. °· Shortness of breath. °· Nausea. °· Sudden cold sweats. °· Feeling light-headed or dizzy. °Sometimes, this condition has no symptoms. °DIAGNOSIS °ACS may be diagnosed through the following tests: °· Electrocardiogram (ECG). °· Blood tests. °· Coronary angiogram. This is a procedure to look at the coronary arteries to see if there is any blockage. °TREATMENT °Treatment for ACS may include: °· Healthy behavioral changes to reduce or control risk factors. °· Medicine. °· Coronary stenting. A stent helps to keep an artery open. °· Coronary angioplasty. This procedure widens a narrowed or blocked artery. °· Coronary artery bypass surgery. This will allow your blood to pass the  blockage (bypass) to reach your heart. °HOME CARE INSTRUCTIONS °Eating and Drinking °· Follow a heart-healthy diet. A dietitian can you help to educate you about healthy food options and changes. °· Use healthy cooking methods such as roasting, grilling, broiling, baking, poaching, steaming, or stir-frying. Talk to a dietitian to learn more about healthy cooking methods. °Medicines °· Take medicines only as directed by your health care provider. °· Do not take the following medicines unless your health care provider approves: °¨ Nonsteroidal anti-inflammatory drugs (NSAIDs), such as ibuprofen, naproxen, or celecoxib. °¨ Vitamin supplements that contain vitamin A, vitamin E, or both. °¨ Hormone replacement therapy that contains estrogen with or without progestin. °· Stop illegal drug use. °Activities °· Follow an exercise program that is approved by your health care provider. °· Plan rest periods when you are fatigued. °Lifestyle °· Do not use any tobacco products, including cigarettes, chewing tobacco, or electronic cigarettes. If you need help quitting, ask your health care provider. °· If you drink alcohol, and your health care provider approves, limit your alcohol intake to no more than 1 drink per day. One drink equals 12 ounces of beer, 5 ounces of wine, or 1½ ounces of hard liquor. °· Learn to manage stress. °· Maintain a healthy weight. Lose weight as approved by your health care provider. °General Instructions °· Manage other health conditions, such as hypertension and diabetes, as directed by your health care provider. °· Keep all follow-up visits as directed by your health care provider. This is important. °· Your health care provider may ask you to monitor your blood   pressure. A blood pressure reading consists of a higher number over a lower number, such as 110 over 72, written as 110/72. Ideally, your blood pressure should be:  Below 140/90 if you have no other medical conditions.  Below 130/80 if  you have diabetes or kidney disease. SEEK IMMEDIATE MEDICAL CARE IF:  You have pain in your chest, neck, arm, jaw, stomach, or back that lasts more than a few minutes, is recurring, or is not relieved by taking medicine under your tongue (sublingual nitroglycerin).  You have profuse sweating without cause.  You have unexplained:  Heartburn or indigestion.  Shortness of breath or difficulty breathing.  Nausea or vomiting.  Fatigue.  Feelings of nervousness or anxiety.  Weakness.  Diarrhea.  You have sudden light-headedness or dizziness.  You faint. These symptoms may represent a serious problem that is an emergency. Do not wait to see if the symptoms will go away. Get medical help right away. Call your local emergency services (911 in the [place] Do not drive yourself to the clinic or hospital.   This information is not intended to replace advice given to you by your health care provider. Make sure you discuss any questions you have with your health care provider.   Document Released: 07/22/2005 Document Revised: 08/12/2014 Document Reviewed: 11/23/2013 Elsevier Interactive Patient Education 2016 [employer]  Acute Coronary Syndrome Acute coronary syndrome (ACS) is a serious problem in which there is suddenly not enough blood and oxygen supplied to the heart. ACS may mean that one or more of the blood vessels in your heart (coronary arteries) may be blocked. ACS can result in chest pain or a heart attack (myocardial infarction or MI). CAUSES This condition is caused by atherosclerosis, which is the buildup of fat and cholesterol (plaque) on the inside of the arteries. Over time, the plaque may narrow or block the artery, and this will lessen blood flow to the heart. Plaque can also become weak and break off within a coronary artery to form a clot and cause a sudden blockage. RISK FACTORS The risks factors of this condition include:  High cholesterol levels.  High blood  pressure (hypertension).  Smoking.  Diabetes.  Age.  Family history of chest pain, heart disease, or stroke.  Lack of exercise. SYMPTOMS The most common signs of this condition include:  Chest pain, which can be:  A crushing or squeezing in the chest.  A tightness, pressure, fullness, or heaviness in the chest.  Present for more than a few minutes, or it can stop and recur.  Pain in the arms, neck, jaw, or back.  Unexplained heartburn or indigestion.  Shortness of breath.  Nausea.  Sudden cold sweats.  Feeling light-headed or dizzy. Sometimes, this condition has no symptoms. DIAGNOSIS ACS may be diagnosed through the following tests:  Electrocardiogram (ECG).  Blood tests.  Coronary angiogram. This is a procedure to look at the coronary arteries to see if there is any blockage. TREATMENT Treatment for ACS may include:  Healthy behavioral changes to reduce or control risk factors.  Medicine.  Coronary stenting.A stent helps to keep an artery open.  Coronary angioplasty. This procedure widens a narrowed or blocked artery.  Coronary artery bypass surgery. This will allow your blood to pass the blockage (bypass) to reach your heart. HOME CARE INSTRUCTIONS Eating and Drinking  Follow a heart-healthy diet. A dietitian can you help to educate you about healthy food options and changes.  Use healthy cooking methods such as roasting, grilling, broiling,  baking, poaching, steaming, or stir-frying. Talk to a dietitian to learn more about healthy cooking methods. Medicines  Take medicines only as directed by your health care provider.  Do not take the following medicines unless your health care provider approves:  Nonsteroidal anti-inflammatory drugs (NSAIDs), such as ibuprofen, naproxen, or celecoxib.  Vitamin supplements that contain vitamin A, vitamin E, or both.  Hormone replacement therapy that contains estrogen with or without progestin.  Stop  illegal drug use. Activities  Follow an exercise program that is approved by your health care provider.  Plan rest periods when you are fatigued. Lifestyle  Do not use any tobacco products, including cigarettes, chewing tobacco, or electronic cigarettes. If you need help quitting, ask your health care provider.  If you drink alcohol, and your health care provider approves, limit your alcohol intake to no more than 1 drink per day. One drink equals 12 ounces of beer, 5 ounces of wine, or 1 ounces of hard liquor.  Learn to manage stress.  Maintain a healthy weight. Lose weight as approved by your health care provider. General Instructions  Manage other health conditions, such as hypertension and diabetes, as directed by your health care provider.  Keep all follow-up visits as directed by your health care provider. This is important.  Your health care provider may ask you to monitor your blood pressure. A blood pressure reading consists of a higher number over a lower number, such as 110 over 72, written as 110/72. Ideally, your blood pressure should be:  Below 140/90 if you have no other medical conditions.  Below 130/80 if you have diabetes or kidney disease. SEEK IMMEDIATE MEDICAL CARE IF:  You have pain in your chest, neck, arm, jaw, stomach, or back that lasts more than a few minutes, is recurring, or is not relieved by taking medicine under your tongue (sublingual nitroglycerin).  You have profuse sweating without cause.  You have unexplained:  Heartburn or indigestion.  Shortness of breath or difficulty breathing.  Nausea or vomiting.  Fatigue.  Feelings of nervousness or anxiety.  Weakness.  Diarrhea.  You have sudden light-headedness or dizziness.  You faint. These symptoms may represent a serious problem that is an emergency. Do not wait to see if the symptoms will go away. Get medical help right away. Call your local emergency services (911 in the [place] Do  not drive yourself to the clinic or hospital.   This information is not intended to replace advice given to you by your health care provider. Make sure you discuss any questions you have with your health care provider.   Document Released: 07/22/2005 Document Revised: 08/12/2014 Document Reviewed: 11/23/2013 Elsevier Interactive Patient Education 2016 [employer] Heart Failure Heart failure means your heart has trouble pumping blood. This makes it hard for your body to work well. Heart failure is usually a long-term (chronic) condition. You must take good care of yourself and follow your doctor's treatment plan. HOME CARE  Take your heart medicine as told by your doctor.  Do not stop taking medicine unless your doctor tells you to.  Do not skip any dose of medicine.  Refill your medicines before they run out.  Take other medicines only as told by your doctor or pharmacist.  Stay active if told by your doctor. The elderly and people with severe heart failure should talk with a doctor about physical activity.  Eat heart-healthy foods. Choose foods that are without trans fat and are low in saturated fat, cholesterol, and salt (  sodium). This includes fresh or frozen fruits and vegetables, fish, lean meats, fat-free or low-fat dairy foods, whole grains, and high-fiber foods. Lentils and dried peas and beans (legumes) are also good choices.  Limit salt if told by your doctor.  Cook in a healthy way. Roast, grill, broil, bake, poach, steam, or stir-fry foods.  Limit fluids as told by your doctor.  Weigh yourself every morning. Do this after you pee (urinate) and before you eat breakfast. Write down your weight to give to your doctor.  Take your blood pressure and write it down if your doctor tells you to.  Ask your doctor how to check your pulse. Check your pulse as told.  Lose weight if told by your doctor.  Stop smoking or chewing tobacco. Do not use gum or patches that help you  quit without your doctor's approval.  Schedule and go to doctor visits as told.  Nonpregnant women should have no more than 1 drink a day. Men should have no more than 2 drinks a day. Talk to your doctor about drinking alcohol.  Stop illegal drug use.  Stay current with shots (immunizations).  Manage your health conditions as told by your doctor.  Learn to manage your stress.  Rest when you are tired.  If it is really hot outside:  Avoid intense activities.  Use air conditioning or fans, or get in a cooler place.  Avoid caffeine and alcohol.  Wear loose-fitting, lightweight, and light-colored clothing.  If it is really cold outside:  Avoid intense activities.  Layer your clothing.  Wear mittens or gloves, a hat, and a scarf when going outside.  Avoid alcohol.  Learn about heart failure and get support as needed.  Get help to maintain or improve your quality of life and your ability to care for yourself as needed. GET HELP IF:   You gain weight quickly.  You are more short of breath than usual.  You cannot do your normal activities.  You tire easily.  You cough more than normal, especially with activity.  You have any or more puffiness (swelling) in areas such as your hands, feet, ankles, or belly (abdomen).  You cannot sleep because it is hard to breathe.  You feel like your heart is beating fast (palpitations).  You get dizzy or light-headed when you stand up. GET HELP RIGHT AWAY IF:   You have trouble breathing.  There is a change in mental status, such as becoming less alert or not being able to focus.  You have chest pain or discomfort.  You faint. MAKE SURE YOU:   Understand these instructions.  Will watch your condition.  Will get help right away if you are not doing well or get worse.   This information is not intended to replace advice given to you by your health care provider. Make sure you discuss any questions you have with your  health care provider.   Document Released: 04/30/2008 Document Revised: 08/12/2014 Document Reviewed: 09/07/2012 Elsevier Interactive Patient Education [employer]

## 2015-07-08 NOTE — Progress Notes (Signed)
Subjective:  No complaints of shortness of breath or chest pain.  Objective:  Vital Signs in the last 24 hours: BP 110/63 mmHg  Pulse 86  Temp(Src) 98.5 F (36.9 C) (Oral)  Resp 18  Ht 5\' 2"  (1.575 m)  Wt 54.84 kg (120 lb 14.4 oz)  BMI 22.11 kg/m2  SpO2 97%  Physical Exam: Pleasant male in no acute distress Lungs:  Clear Cardiac:  Regular rhythm, normal S1 and S2, no S3 Extremities:  No edema present  Intake/Output from previous day: 12/02 0701 - 12/03 0700 In: 1360 [P.O.:1360] Out: -   Weight Filed Weights   07/05/15 1230 07/08/15 0439  Weight: 55.5 kg (122 lb 5.7 oz) 54.84 kg (120 lb 14.4 oz)    Lab Results: Basic Metabolic Panel:  Recent Labs  40/98/1111/30/16 1042 07/06/15 0250  NA 139 139  K 3.7 4.4  CL 99* 104  CO2  --  21*  GLUCOSE 185* 82  BUN 5* 6  CREATININE 1.20 0.97   CBC:  Recent Labs  07/06/15 0250 07/07/15 0332  WBC 19.9* 11.3*  HGB 19.0* 17.1*  HCT 55.3* 50.8  MCV 89.5 91.0  PLT 212 194   Cardiac Panel (last 3 results)  Recent Labs  07/06/15 0835 07/06/15 1424 07/06/15 2015  TROPONINI 26.15* 22.20* 17.56*    Telemetry: Sinus rhythm  Assessment/Plan:  1.  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  Severe LV dysfunction out of proportion to extent of coronary disease 3.  Hyperlipidemia 4.  Polycythemia noted that will need to be addressed later  Recommendations:  May be discharged today and follow-up in one week.     Darden PalmerW. Spencer Naser Schuld, Jr.  MD [facility] HospitalFACC [facility]  07/08/2015, 10:41 AM

## 2015-07-08 NOTE — Discharge Summary (Signed)
Discharge Summary   Patient ID: Jason Barry,  MRN: [id number], DOB/AGE: 1955/11/18 59 y.o.  Admit date: 07/05/2015 Discharge date: 07/08/2015  Primary Care Provider: No primary care provider on file. Primary Cardiologist: Dr. Tresa Endo  Discharge Diagnoses    Principal Problem:   ST elevation myocardial infarction involving right coronary artery [name]  - s/p PCI of dRCA-rPDA but unable to revascularize rPAV-RPL. LAD treated medically for now, considering further OP workup. Active Problems:   CAD S/P percutaneous coronary angioplasty   Presence of drug coated stent in right coronary artery   Cardiomyopathy, ischemic; EF 30% (out of proportion to CAD)   Hyperlipidemia with target LDL less than 70; Poorly controlled   Abnormal LFTs   Polycythemia   Tobacco abuse  Allergies No Known Allergies  Diagnostic Studies/Procedures    1) Cardiac catheterization this admission, please see full report and above for summary. 2) 2D Echo 07/06/15 - - Left ventricle: The cavity size was normal. Wall thickness wasnormal. The estimated ejection fraction was 30%. Diffusehypokinesis with inferior akinesis. Doppler parameters areconsistent with abnormal left ventricular relaxation (grade 1 diastolic dysfunction). - Aortic valve: There was no stenosis. - Mitral valve: Mildly calcified annulus. Mildly calcified leaflets. There was trivial regurgitation. - Left atrium: The atrium was mildly dilated. - Right ventricle: The cavity size was normal. Systolic functionwas normal. - Pulmonary arteries: No complete TR doppler jet so unable toestimate PA systolic pressure. - Inferior vena cava: The vessel was normal in size. Therespirophasic diameter changes were in the normal range (>= 50%), consistent with normal central venous pressure. Impressions:- Normal LV size with EF 30%. Diffuse hypokinesis with inferiorakinesis. Normal RV size and systolic function. No significantvalvular  abnormalities. _____________   History of Present Illness  Jason Barry is a 59 y/o M with longstanding history of tobacco abuse who was admitted with 3-day history of chest pain and inferior STEMI.   Hospital Course   He had been having chest discomfort for 3-days that acutely worsened on day of admission. He follows with the [name] though had not seen a doctor in at least a year. He apparently started smoking at age 58 (his aunt would light cigarettes for him) and he increased the amount he was smoking by age 78! Initial EKG in the ED was unequivocal though repeat EKG was notable for ST elevations in the inferior leads. He was taken emergently to the cath lab and was found to have two vessel coronary obstructive disease with 70% smooth tubular eccentric ostial LAD stenosis; normal left circumflex: and RCA with diffuse distal disease with 50, 99, 50% stenoses with total occlusion of the PLA branch. He underwent PTCA/DES placement to the distal RCA. EF was <20% by cath. Smoking cessation was strongly advised. His cath films were reviewed with regard to his residual LAD stenosis - [name] agreed that the LAD lesion did look significant & may indeed require intervention, but recommended for him to from  initial MI and begin medical therapy. He recommended to observe for recurrent symptoms and if he remained stable could consider Myoview in the outpatient setting once stable post-MI. He was started on ASA, Brilinta, BB, statin. It was felt that his cardiomyopathy was well out of proportion to his CAD thus LifeVest was not recommended. He has not had any hemodynamic instability or arrhythmias this admission. F/u 2D echo 07/06/15: EF 30%, diffuse HK with inferior akinesis, normal RV, grade 1 DD, trivial MR. The patient has ambulated well without complication or recurrent anginal  symptoms. He was started on high dose statin - LFTs were mildly abnormal as inpatient (AST 111, Tbili 2.1) which was felt due to  MI - would consider rechecking at f/u appt in 1 week. If the patient is tolerating statin at time of follow-up appointment, would consider liver function/lipid panel in 6-8 weeks. LDL was 160. BP is running slightly on the softer side so he was not started on ACEI/ARB/spironolactone although this can be considered in the outpatient setting. He also had polycythemia noted on his labs and was instructed to f/u PCP for this. I have sent a message to our [name] requesting a 1 week follow-up appointment, and our office will call the patient with this information. [name] has seen and examined the patient today and feels he is stable for discharge. He will receive Brilinta 30 day rx with refills. I also filled out his assistance form. I also spoke with him regarding recommenations for low sodium diet, daily weights, fluid restriction and monitoring for CHF sx. He was asked to remain out of work until cleared by his cardiologist.  Consultants: N/A _____________  Discharge Vitals Blood pressure 110/63, pulse 86, temperature 98.5 F (36.9 C), temperature source Oral, resp. rate 18, height 5\' 2"  (1.575 m), weight 120 lb 14.4 oz (54.84 kg), SpO2 97 %.  Filed Weights   07/05/15 1230 07/08/15 0439  Weight: 122 lb 5.7 oz (55.5 kg) 120 lb 14.4 oz (54.84 kg)   _____________  Labs     CBC  Recent Labs  07/06/15 0250 07/07/15 0332  WBC 19.9* 11.3*  HGB 19.0* 17.1*  HCT 55.3* 50.8  MCV 89.5 91.0  PLT 212 194   Basic Metabolic Panel  Recent Labs  07/06/15 0250  NA 139  K 4.4  CL 104  CO2 21*  GLUCOSE 82  BUN 6  CREATININE 0.97  CALCIUM 9.0   Liver Function Tests  Recent Labs  07/06/15 0250  AST 111*  ALT 27  ALKPHOS 56  BILITOT 2.1*  PROT 5.5*  ALBUMIN 3.0*   Cardiac Enzymes  Recent Labs  07/06/15 0835 07/06/15 1424 07/06/15 2015  TROPONINI 26.15* 22.20* 17.56*    Recent Labs  07/07/15 0332  CHOL 221*  HDL 38*  LDLCALC 160*  TRIG 117   CHOLHDL 5.8   _____________  Disposition   Pt is being discharged home today in good condition. _____________  Follow-up Plans & Appointments    Follow-up Information    Follow up with Lennette Bihari, MD.   Specialty:  Cardiology   Why:  Office will call you for your followup appointment. Make sure to clarify which office location your appointment will be at [facility] or [place]). Call office if you have not heard back in 3 days.   Contact information:   [address] [place] [address] [phone]       Follow up with Primary Care Provider.   Why:  Your blood counts were somewhat elevated, possibly due to longstanding history of smoking. Please discuss further monitoring with your primary care provider.     Discharge Instructions    AMB Referral to Cardiac Rehabilitation - Phase II    Complete by:  As directed   Diagnosis:  Myocardial Infarction     Diet - low sodium heart healthy    Complete by:  As directed      Increase activity slowly    Complete by:  As directed   No driving for  2 weeks. No lifting over 10 lbs for 4 weeks. No sexual activity for 4 weeks. You may not return to work until cleared by your cardiologist. Keep procedure site clean & dry. If you notice increased pain, swelling, bleeding or pus, call/return!  You may shower, but no soaking baths/hot tubs/pools for 1 week.   Patients taking aspirin and Brilinta should generally stay away from medicines like ibuprofen, Advil, Motrin, naproxen, and Aleve due to risk of stomach bleeding. You may take Tylenol as directed or talk to your primary doctor about alternatives.   You should also avoid medicines with phenylephrine or pseudoephedrine in them due to your heart disease - this is why we discontinued Alka-Seltzer off your medicine list.  One of your heart tests showed weakness of the heart muscle this admission. This may make you more susceptible to weight gain from fluid retention,  which can lead to symptoms that we call heart failure. Follow these special instructions:  1. Follow a low-salt diet and watch your fluid intake. In general, you should not be taking in more than 2 liters of fluid per day (no more than 8 glasses per day). Some patients are restricted to less than 1.5 liters of fluid per day (no more than 6 glasses per day). This includes sources of water in foods like soup, coffee, tea, milk, etc. 2. Weigh yourself on the same scale at same time of day and keep a log. 3. Call your doctor: (Anytime you feel any of the following symptoms)  - 3-4 pound weight gain in 1-2 days or 2 pounds overnight  - Shortness of breath, with or without a dry hacking cough  - Swelling in the hands, feet or stomach  - If you have to sleep on extra pillows at night in order to breathe  IT IS IMPORTANT TO LET YOUR DOCTOR KNOW EARLY ON IF YOU ARE HAVING SYMPTOMS SO WE CAN HELP YOU!           Discharge Medications   Current Discharge Medication List    START taking these medications   Details  aspirin EC 81 MG EC tablet Take 1 tablet (81 mg total) by mouth daily. Qty: 30 tablet, Refills: 11    atorvastatin (LIPITOR) 80 MG tablet Take 1 tablet (80 mg total) by mouth every evening. Qty: 30 tablet, Refills: 6    carvedilol (COREG) 3.125 MG tablet Take 1 tablet (3.125 mg total) by mouth 2 (two) times daily with a meal. Qty: 60 tablet, Refills: 6    nitroGLYCERIN (NITROSTAT) 0.4 MG SL tablet Place 1 tablet (0.4 mg total) under the tongue every 5 (five) minutes as needed for chest pain (up to 3 doses). Qty: 25 tablet, Refills: 3    ticagrelor (BRILINTA) 90 MG TABS tablet Take 1 tablet (90 mg total) by mouth 2 (two) times daily. Qty: 60 tablet, Refills: 11      STOP taking these medications     Phenyleph-Doxylamine-DM-APAP (ALKA-SELTZER PLS NIGHT CLD/FLU PO)      naproxen (NAPROSYN) 500 MG tablet          Aspirin prescribed at discharge:  Yes High Intensity Statin  Prescribed? (Lipitor 40-80mg  or Crestor 20-40mg ): Yes Beta Blocker Prescribed: Yes ADP Receptor Inhibitor Prescribed? (i.e. Plavix etc.-Includes Medically Managed Patients): Yes For EF <40%, Aldosterone Inhibitor Prescribed? No: BP on lower side Was EF assessed during THIS hospitalization? Yes Was Cardiac Rehab II ordered? (Included Medically managed Patients): Yes _____________   Outstanding Labs/Studies   See above  regarding suggestions  Duration of Discharge Encounter   Greater than 30 minutes including physician time.  Signed, Ronie Spies PA-C 07/08/2015, 12:04 PM

## 2015-07-12 ENCOUNTER — Telehealth: Payer: Self-pay | Admitting: Cardiovascular Disease

## 2015-07-12 NOTE — Telephone Encounter (Signed)
TCM per Dayna-  12/14 @ 10am w/ [place] @ [facility]- wanted to specifically speak w/ RN concerning his driving restrictions. Please call back and discuss.

## 2015-07-12 NOTE — Telephone Encounter (Signed)
Returned Call.  Pt concerned that he won't be able to drive to his next appt.  His discharge says no driving for 2 weeks.  I suggested that he get someone to drive him to the appt.

## 2015-07-19 ENCOUNTER — Encounter: Payer: Self-pay | Admitting: Cardiology

## 2015-07-19 ENCOUNTER — Ambulatory Visit (INDEPENDENT_AMBULATORY_CARE_PROVIDER_SITE_OTHER): Payer: Self-pay | Admitting: Cardiology

## 2015-07-19 VITALS — BP 100/80 | HR 74 | Ht 62.0 in | Wt 120.0 lb

## 2015-07-19 DIAGNOSIS — I25119 Atherosclerotic heart disease of native coronary artery with unspecified angina pectoris: Secondary | ICD-10-CM

## 2015-07-19 DIAGNOSIS — E785 Hyperlipidemia, unspecified: Secondary | ICD-10-CM

## 2015-07-19 DIAGNOSIS — I5022 Chronic systolic (congestive) heart failure: Secondary | ICD-10-CM

## 2015-07-19 DIAGNOSIS — R7989 Other specified abnormal findings of blood chemistry: Secondary | ICD-10-CM

## 2015-07-19 LAB — HEPATIC FUNCTION PANEL
ALBUMIN: 3.7 g/dL (ref 3.6–5.1)
ALK PHOS: 81 U/L (ref 40–115)
ALT: 15 U/L (ref 9–46)
AST: 17 U/L (ref 10–35)
Bilirubin, Direct: 0.2 mg/dL (ref <=0.2)
Indirect Bilirubin: 1.2 mg/dL (ref 0.2–1.2)
TOTAL PROTEIN: 7.1 g/dL (ref 6.1–8.1)
Total Bilirubin: 1.4 mg/dL — ABNORMAL HIGH (ref 0.2–1.2)

## 2015-07-19 MED ORDER — NITROGLYCERIN 0.4 MG SL SUBL: 0.4000 mg | SUBLINGUAL_TABLET | SUBLINGUAL | Status: AC | PRN

## 2015-07-19 MED ORDER — TICAGRELOR 90 MG PO TABS: 90.0000 mg | ORAL_TABLET | Freq: Two times a day (BID) | ORAL | Status: AC

## 2015-07-19 MED ORDER — CARVEDILOL 3.125 MG PO TABS: 3.1250 mg | ORAL_TABLET | Freq: Two times a day (BID) | ORAL | Status: AC

## 2015-07-19 MED ORDER — ATORVASTATIN CALCIUM 80 MG PO TABS: 80.0000 mg | ORAL_TABLET | Freq: Every evening | ORAL | Status: AC

## 2015-07-19 NOTE — Progress Notes (Signed)
07/19/2015 Jason Barry   06/07/1956  [id number]  Primary Physician No PCP Per Patient Primary Cardiologist: Dr. Tresa EndoKelly   Reason for Visit/CC: [facility] F/u for CAD s/p STEMI  HPI:  59 y/o male with long h/o tobacco use (smoking since age 59!) and recent diagnosis of CAD who presents to clinic for post hospital f/u. [facility] for inferior STEMI. Emergent LHC showed two vessel coronary obstructive disease with 70% smooth tubular eccentric ostial LAD stenosis; normal left circumflex: and RCA with diffuse distal disease with 50, 99, 50% stenoses with total occlusion of the PLA branch. He underwent PTCA/DES placement to the distal RCA. His cath films were reviewed with regard to his residual LAD stenosis - Dr. Herbie BaltimoreHarding agreed that the LAD lesion did look significant & may indeed require intervention, but recommended for him to recover from initial MI and begin medical therapy. His EF at time of cath was 20%. F/u echo showed an EF of 30%. He was placed on DAPT with ASA + Brilinta along with Coreg and atorvastatin. His BP did not support initiation of ACE-I. He was also noted to have abnormal LFTs (AST 111, Tbili 2.1), that was felt to be due to his MI. However repeat LFTs have been recommended.   Today in follow-up he reports that he has done fairly well. However, he continues to have mild chest discomfort almost daily, although not severe as him MI pain. Symptoms only last several seconds/ less than a minute. He has not required use of SL NTG. Symptoms usually resolve with rest. He reports full medication compliance. He is tolerating his meds well w/o side effects. He has adjusted his diet and has cut back on sodium and fatty foods. Also compliant with daily weights. He denies any dyspnea, orthopnea, PND, edema or weight gain.  He continues to smoke but notes that he has cut back and now only smokes 3 cigarettes a day.     Current Outpatient Prescriptions  Medication Sig  Dispense Refill  . aspirin EC 81 MG EC tablet Take 1 tablet (81 mg total) by mouth daily. 30 tablet 11  . atorvastatin (LIPITOR) 80 MG tablet Take 1 tablet (80 mg total) by mouth every evening. 30 tablet 6  . carvedilol (COREG) 3.125 MG tablet Take 1 tablet (3.125 mg total) by mouth 2 (two) times daily with a meal. 60 tablet 6  . nitroGLYCERIN (NITROSTAT) 0.4 MG SL tablet Place 1 tablet (0.4 mg total) under the tongue every 5 (five) minutes as needed for chest pain (up to 3 doses). 25 tablet 3  . ticagrelor (BRILINTA) 90 MG TABS tablet Take 1 tablet (90 mg total) by mouth 2 (two) times daily. 60 tablet 11   No current facility-administered medications for this visit.    No Known Allergies  Social History   Social History  . Marital Status: Single    Spouse Name: N/A  . Number of Children: N/A  . Years of Education: N/A   Occupational History  . Not on file.   Social History Main Topics  . Smoking status: Current Every Day Smoker  . Smokeless tobacco: Not on file  . Alcohol Use: No  . Drug Use: Not on file  . Sexual Activity: Not on file   Other Topics Concern  . Not on file   Social History Narrative     Review of Systems: General: negative for chills, fever, night sweats or weight changes.  Cardiovascular: negative for chest pain,  dyspnea on exertion, edema, orthopnea, palpitations, paroxysmal nocturnal dyspnea or shortness of breath Dermatological: negative for rash Respiratory: negative for cough or wheezing Urologic: negative for hematuria Abdominal: negative for nausea, vomiting, diarrhea, bright red blood per rectum, melena, or hematemesis Neurologic: negative for visual changes, syncope, or dizziness All other systems reviewed and are otherwise negative except as noted above.    Blood pressure 100/80, pulse 74, height  (1.575 m), weight 120 lb (54.432 kg).  General appearance: alert, cooperative and no distress Neck: no carotid bruit and no JVD Lungs:  clear to auscultation bilaterally Heart: regular rate and rhythm, S1, S2 normal, no murmur, click, rub or gallop Extremities: no LEE Pulses: 2+ and symmetric Skin: warm and dry Neurologic: Grossly normal  EKG NSR. Non acute  ASSESSMENT AND PLAN:   1. CAD: s/p STEMI treated with PCI + DES to distal RCA. Also with residual LAD disease. He continues to have intermittent angina, relieved quickly with rest. He has not required use of SL NTG. His BP will not tolerate titration of BB . Per Dr. Elissa Hefty recommendation, we will plan to have patient undergo a lexiscan Myoview to assess for ischemia. If significant anterior ischemia, will plan for intervention of LAD. For now continue medical therapy with ASA, Brilinta, Coreg and atorvastatin. No ACE-I due to soft BP. Proper use of SL NTG reviewed.  2. Chronic Systolic CHF: EF 16% post MI. He is euvolemic on physical exam. No dsypsnea. He has been fully compliant with daily weights and watching sodium intake. Continue low sodium diet, daily weights and BB therapy with Coreg. Unable to tolerate initiation of ACE/ARB therapy given soft BP.   3. Abnormal LFTs: suspected to be subsequent to his recent MI. AST was 111 and Tbili was 2.1 during hospitalization. He was discharged home on high dose Lipitor. Will recheck LFTs today.   4. HLD: LDL was 160 mg/dL. He is on high dose Lipitor. Recheck FLP in 6 weeks. Low fat diet encouraged.   5. Tobacco Abuse: nearly 60+ year history. Complete smoking cessation advised.   PLAN  Plan for NST to assess for ischemia in the LAD distribution. F/u with Dr. Tresa Endo after stress test to determine need for intervention.   Robbie Lis PA-C 07/19/2015 10:32 AM

## 2015-07-19 NOTE — Patient Instructions (Signed)
Medication Instructions:  Your physician recommends that you continue on your current medications as directed. Please refer to the Current Medication list given to you today. Refill Rx's have been sent to your pharmacy  Labwork: LFT today  Testing/Procedures: Your physician has requested that you have a lexiscan myoview. For further information please visit [email or web address]. Please follow instruction sheet, as given.   Follow-Up: Your physician recommends that you schedule a follow-up appointment in: 2 weeks with Dr.Kelly   Any Other Special Instructions Will Be Listed Below (If Applicable). Your physician discussed the hazards of tobacco use. Tobacco use cessation is recommended and techniques and options to help you quit were discussed.       If you need a refill on your cardiac medications before your next appointment, please call your pharmacy.

## 2015-07-27 ENCOUNTER — Telehealth (HOSPITAL_COMMUNITY): Payer: Self-pay | Admitting: *Deleted

## 2015-07-27 NOTE — Telephone Encounter (Signed)
Patient given detailed instructions per Myocardial Perfusion Study Information Sheet for the test on 08/02/15 at 1000. Patient notified to arrive 15 minutes early and that it is imperative to arrive on time for appointment to keep from having the test rescheduled.  If you need to cancel or reschedule your appointment, please call the office within 24 hours of your appointment. Failure to do so may result in a cancellation of your appointment, and a $50 no show fee. Patient verbalized understanding.Terrell Shimko, Adelene IdlerCynthia W

## 2015-08-02 ENCOUNTER — Encounter (HOSPITAL_COMMUNITY): Payer: Self-pay

## 2015-08-02 ENCOUNTER — Ambulatory Visit (HOSPITAL_COMMUNITY): Payer: Self-pay | Attending: Cardiovascular Disease

## 2015-08-02 DIAGNOSIS — R0602 Shortness of breath: Secondary | ICD-10-CM

## 2015-08-02 DIAGNOSIS — I517 Cardiomegaly: Secondary | ICD-10-CM | POA: Insufficient documentation

## 2015-08-02 DIAGNOSIS — I25119 Atherosclerotic heart disease of native coronary artery with unspecified angina pectoris: Secondary | ICD-10-CM | POA: Insufficient documentation

## 2015-08-02 DIAGNOSIS — R9439 Abnormal result of other cardiovascular function study: Secondary | ICD-10-CM | POA: Insufficient documentation

## 2015-08-02 DIAGNOSIS — R0789 Other chest pain: Secondary | ICD-10-CM | POA: Insufficient documentation

## 2015-08-02 LAB — MYOCARDIAL PERFUSION IMAGING
CHL CUP NUCLEAR SDS: 2
CHL CUP NUCLEAR SRS: 10
CHL CUP NUCLEAR SSS: 12
LHR: 0.4
LV sys vol: 73 mL
LVDIAVOL: 112 mL
NUC STRESS TID: 1.09
Peak HR: 91 {beats}/min
Rest HR: 63 {beats}/min

## 2015-08-02 MED ORDER — TECHNETIUM TC 99M SESTAMIBI GENERIC - CARDIOLITE
10.9000 | Freq: Once | INTRAVENOUS | Status: AC | PRN
  Administered 2015-08-02: 10.9 via INTRAVENOUS

## 2015-08-02 MED ORDER — TECHNETIUM TC 99M SESTAMIBI GENERIC - CARDIOLITE
33.0000 | Freq: Once | INTRAVENOUS | Status: AC | PRN
  Administered 2015-08-02: 33 via INTRAVENOUS

## 2015-08-02 MED ORDER — REGADENOSON 0.4 MG/5ML IV SOLN
0.4000 mg | Freq: Once | INTRAVENOUS | Status: AC
  Administered 2015-08-02: 0.4 mg via INTRAVENOUS

## 2015-08-02 MED ORDER — AMINOPHYLLINE 25 MG/ML IV SOLN
75.0000 mg | Freq: Once | INTRAVENOUS | Status: AC
  Administered 2015-08-02: 75 mg via INTRAVENOUS

## 2015-08-15 ENCOUNTER — Ambulatory Visit (INDEPENDENT_AMBULATORY_CARE_PROVIDER_SITE_OTHER): Payer: Self-pay | Admitting: Physician Assistant

## 2015-08-15 ENCOUNTER — Encounter: Payer: Self-pay | Admitting: Physician Assistant

## 2015-08-15 VITALS — BP 110/70 | HR 60 | Ht 62.0 in | Wt 121.6 lb

## 2015-08-15 DIAGNOSIS — R945 Abnormal results of liver function studies: Secondary | ICD-10-CM

## 2015-08-15 DIAGNOSIS — I5022 Chronic systolic (congestive) heart failure: Secondary | ICD-10-CM

## 2015-08-15 DIAGNOSIS — E785 Hyperlipidemia, unspecified: Secondary | ICD-10-CM

## 2015-08-15 DIAGNOSIS — I255 Ischemic cardiomyopathy: Secondary | ICD-10-CM

## 2015-08-15 DIAGNOSIS — Z9861 Coronary angioplasty status: Secondary | ICD-10-CM

## 2015-08-15 DIAGNOSIS — R7989 Other specified abnormal findings of blood chemistry: Secondary | ICD-10-CM

## 2015-08-15 DIAGNOSIS — Z720 Tobacco use: Secondary | ICD-10-CM

## 2015-08-15 DIAGNOSIS — I2510 Atherosclerotic heart disease of native coronary artery without angina pectoris: Secondary | ICD-10-CM

## 2015-08-15 NOTE — Patient Instructions (Signed)
Your physician recommends that you schedule a follow-up appointment in: 2 MONTHS with Dr. Tresa EndoKelly.  No changes in therapy has been made today. Continue all medications as prescribed.

## 2015-08-15 NOTE — Progress Notes (Signed)
Patient ID: Jason Barry, male   DOB: 08/08/1955, 60 y.o.   MRN: [id number]    Date:  08/15/2015   ID:  Jason Barry, DOB 01/25/1956, MRN [id number]  PCP:  No PCP Per Patient  Primary Cardiologist:  Tresa EndoKelly  Chief Complaint  Patient presents with  . Follow-up    Follow-up stress test:  Occas. chest tightness, SOB and dizziness.  No edema     History of Present Illness:  Jason Barry is a 60 y.o. male with long h/o tobacco use (smoking since age 68!) and recent diagnosis of CAD who presented to clinic for post hospital f/u on 07/19/15. [facility]15 for inferior STEMI. Emergent LHC showed two vessel coronary obstructive disease with 70% smooth tubular eccentric ostial LAD stenosis; normal left circumflex: and RCA with diffuse distal disease with 50, 99, 50% stenoses with total occlusion of the PLA branch. He underwent PTCA/DES placement to the distal RCA. His cath films were reviewed with regard to his residual LAD stenosis - Dr. Herbie BaltimoreHarding agreed that the LAD lesion did look significant & may indeed require intervention, but recommended for him to recover from initial MI and begin medical therapy. His EF at time of cath was 20%. F/u echo showed an EF of 30%. He was placed on DAPT with ASA + Brilinta along with Coreg and atorvastatin. His BP did not support initiation of ACE-I. He was also noted to have abnormal LFTs (AST 111, Tbili 2.1), that was felt to be due to his MI.  The patient underwent a nuclear stress test 08/02/15 which revealed moderate sized area of inferior wall infarct from apex to base with no ischemia EF 35% inferior wall akinesis.  Patient is now here for follow-up. He does report some chest tightness as well as shortness of breath. The tightness can occur if he is lying, sitting, walking. It is really random and does not always occur with exertion.  He continues to smoke although he says trying to quit. He has not picked a quit date. He had a follow-up LFT panel  which showed normalization of AST total protein albumen indirect bilirubin. His total bilirubin still mildly elevated.  The patient currently denies nausea, vomiting, fever, orthopnea, dizziness, PND, cough, congestion, abdominal pain, hematochezia, melena, lower extremity edema, claudication.  Wt Readings from Last 3 Encounters:  08/15/15 121 lb 9.6 oz (55.157 kg)  07/19/15 120 lb (54.432 kg)  07/08/15 120 lb 14.4 oz (54.84 kg)     Past Medical History  Diagnosis Date  . ST elevation myocardial infarction involving right coronary artery (HCC) 07/05/2015  . CAD (coronary artery disease) 07/06/2015    a. 99% dRCA with 100% rPAV, long-tubular ~70% pLAD & mid D1 - PCI of dRCA-rPDA but unable to revascularize rPAV-RPL. LAD treated medically for now, considering further OP workup.  . Cardiomyopathy (HCC)     a. Echo 07/07/15: EF 30%, Inf Akinesis wtih Global HK, normal valves  . Chronic systolic CHF (congestive heart failure) (HCC)   . Hyperlipidemia   . Abnormal LFTs   . Tobacco abuse   . Polycythemia     Current Outpatient Prescriptions  Medication Sig Dispense Refill  . aspirin EC 81 MG EC tablet Take 1 tablet (81 mg total) by mouth daily. 30 tablet 11  . atorvastatin (LIPITOR) 80 MG tablet Take 1 tablet (80 mg total) by mouth every evening. 30 tablet 6  . carvedilol (COREG) 3.125 MG tablet Take 1 tablet (3.125 mg total) by mouth 2 (  two) times daily with a meal. 60 tablet 6  . nitroGLYCERIN (NITROSTAT) 0.4 MG SL tablet Place 1 tablet (0.4 mg total) under the tongue every 5 (five) minutes as needed for chest pain (up to 3 doses). 25 tablet 3  . ticagrelor (BRILINTA) 90 MG TABS tablet Take 1 tablet (90 mg total) by mouth 2 (two) times daily. 60 tablet 11   No current facility-administered medications for this visit.    Allergies:   No Known Allergies  Social History:  The patient  reports that he has been smoking.  He does not have any smokeless tobacco history on file. He reports that  he does not drink alcohol.   Family history:   Family History  Problem Relation Age of Onset  . Heart attack Neg Hx   . Hypertension Neg Hx   . Stroke Neg Hx     ROS:  Please see the history of present illness.  All other systems reviewed and negative.   PHYSICAL EXAM: VS:  BP 110/70 mmHg  Pulse 60  Ht 5\' 2"  (1.575 m)  Wt 121 lb 9.6 oz (55.157 kg)  BMI 22.24 kg/m2 Well nourished, well developed, in no acute distress HEENT: Pupils are equal round react to light accommodation extraocular movements are intact.  Neck: no JVDNo cervical lymphadenopathy. Cardiac: Regular rate and rhythm positive S3 without murmurs rubs. Lungs:  clear to auscultation bilaterally, no wheezing, rhonchi or rales Abd: soft, nontender, positive bowel sounds all quadrants, no hepatosplenomegaly Ext: no lower extremity edema.  2+ radial and dorsalis pedis pulses. Skin: warm and dry Neuro:  Grossly normal  Lipid Panel     Component Value Date/Time   CHOL 221* 07/07/2015 0332   TRIG 117 07/07/2015 0332   HDL 38* 07/07/2015 0332   CHOLHDL 5.8 07/07/2015 0332   VLDL 23 07/07/2015 0332   LDLCALC 160* 07/07/2015 0332      ASSESSMENT AND PLAN:  Problem List Items Addressed This Visit    Tobacco abuse   Hyperlipidemia with target LDL less than 70; Poorly controlled   Chronic systolic CHF (congestive heart failure) (HCC)   Cardiomyopathy, ischemic; EF 30% (out of proportion to CAD)   CAD S/P percutaneous coronary angioplasty - Primary   Abnormal LFTs     Patient is here for follow-up of his MI. He reports doing well. He has some periodic chest tightness which occurs laying, sitting, walking. It is nonspecific.  He can exert himself and not have chest pain.  Stress test showed no ischemia.   He does have a 70% lesion in his LAD and this may need intervention in the future.   Also had some mild shortness of breath(he has been smoking since age 72!).  He currently appears euvolemic.  He brought in his charts  for his weight and sodium intake. We made additional copies complaint charts form to continue this.  We did discuss tobacco cessation and encouraged him to pick a quit date.  His LDL on December 2 was 160. We'll continue high-dose statin.  His follow-up lipid panel showed normalization of his LFTs.  Clinical back to work this coming Monday.

## 2015-10-16 ENCOUNTER — Ambulatory Visit: Payer: Self-pay | Admitting: Cardiovascular Disease

## 2015-10-16 ENCOUNTER — Encounter: Payer: Self-pay | Admitting: *Deleted

## 2016-01-01 ENCOUNTER — Inpatient Hospital Stay (HOSPITAL_COMMUNITY)
Admission: EM | Admit: 2016-01-01 | Discharge: 2016-01-03 | DRG: 247 | Disposition: A | Discharge status: Home / Self Care | Payer: MEDICAID | Attending: Cardiology | Admitting: Cardiology

## 2016-01-01 ENCOUNTER — Encounter (HOSPITAL_COMMUNITY): Payer: Self-pay | Admitting: Emergency Medicine

## 2016-01-01 ENCOUNTER — Emergency Department (HOSPITAL_COMMUNITY): Payer: MEDICAID

## 2016-01-01 DIAGNOSIS — I252 Old myocardial infarction: Secondary | ICD-10-CM

## 2016-01-01 DIAGNOSIS — E876 Hypokalemia: Secondary | ICD-10-CM | POA: Diagnosis present

## 2016-01-01 DIAGNOSIS — Z7982 Long term (current) use of aspirin: Secondary | ICD-10-CM

## 2016-01-01 DIAGNOSIS — I255 Ischemic cardiomyopathy: Secondary | ICD-10-CM | POA: Diagnosis present

## 2016-01-01 DIAGNOSIS — I25110 Atherosclerotic heart disease of native coronary artery with unstable angina pectoris: Principal | ICD-10-CM | POA: Diagnosis present

## 2016-01-01 DIAGNOSIS — R079 Chest pain, unspecified: Secondary | ICD-10-CM | POA: Diagnosis present

## 2016-01-01 DIAGNOSIS — E785 Hyperlipidemia, unspecified: Secondary | ICD-10-CM | POA: Diagnosis present

## 2016-01-01 DIAGNOSIS — I2510 Atherosclerotic heart disease of native coronary artery without angina pectoris: Secondary | ICD-10-CM

## 2016-01-01 DIAGNOSIS — I200 Unstable angina: Secondary | ICD-10-CM | POA: Insufficient documentation

## 2016-01-01 DIAGNOSIS — Z87891 Personal history of nicotine dependence: Secondary | ICD-10-CM

## 2016-01-01 DIAGNOSIS — I5022 Chronic systolic (congestive) heart failure: Secondary | ICD-10-CM | POA: Diagnosis present

## 2016-01-01 DIAGNOSIS — Z79899 Other long term (current) drug therapy: Secondary | ICD-10-CM

## 2016-01-01 DIAGNOSIS — Z955 Presence of coronary angioplasty implant and graft: Secondary | ICD-10-CM

## 2016-01-01 LAB — CBC
HEMATOCRIT: 49.4 % (ref 39.0–52.0)
HEMOGLOBIN: 16.8 g/dL (ref 13.0–17.0)
MCH: 30.4 pg (ref 26.0–34.0)
MCHC: 34 g/dL (ref 30.0–36.0)
MCV: 89.3 fL (ref 78.0–100.0)
Platelets: 146 10*3/uL — ABNORMAL LOW (ref 150–400)
RBC: 5.53 MIL/uL (ref 4.22–5.81)
RDW: 13.9 % (ref 11.5–15.5)
WBC: 9.8 10*3/uL (ref 4.0–10.5)

## 2016-01-01 LAB — BASIC METABOLIC PANEL
ANION GAP: 11 (ref 5–15)
BUN: 9 mg/dL (ref 6–20)
CHLORIDE: 104 mmol/L (ref 101–111)
CO2: 21 mmol/L — ABNORMAL LOW (ref 22–32)
Calcium: 8.2 mg/dL — ABNORMAL LOW (ref 8.9–10.3)
Creatinine, Ser: 1.04 mg/dL (ref 0.61–1.24)
GFR calc Af Amer: >60 mL/min (ref >60)
Glucose, Bld: 121 mg/dL — ABNORMAL HIGH (ref 65–99)
POTASSIUM: 3.6 mmol/L (ref 3.5–5.1)
SODIUM: 136 mmol/L (ref 135–145)

## 2016-01-01 LAB — TROPONIN I
TROPONIN I: 0.04 ng/mL — AB (ref <0.031)
TROPONIN I: 0.04 ng/mL — AB (ref <0.031)
TROPONIN I: 0.04 ng/mL — AB (ref <0.031)

## 2016-01-01 LAB — I-STAT TROPONIN, ED: Troponin i, poc: 0.04 ng/mL (ref 0.00–0.08)

## 2016-01-01 LAB — MAGNESIUM: MAGNESIUM: 1.7 mg/dL (ref 1.7–2.4)

## 2016-01-01 LAB — TSH: TSH: 1.817 u[IU]/mL (ref 0.350–4.500)

## 2016-01-01 LAB — D-DIMER, QUANTITATIVE (NOT AT ARMC)

## 2016-01-01 LAB — PROTIME-INR
INR: 1.01 (ref 0.00–1.49)
Prothrombin Time: 13.5 seconds (ref 11.6–15.2)

## 2016-01-01 LAB — MRSA PCR SCREENING: MRSA BY PCR: NEGATIVE

## 2016-01-01 LAB — HEPARIN LEVEL (UNFRACTIONATED): HEPARIN UNFRACTIONATED: 0.73 [IU]/mL — AB (ref 0.30–0.70)

## 2016-01-01 LAB — BRAIN NATRIURETIC PEPTIDE: B NATRIURETIC PEPTIDE 5: 161.6 pg/mL — AB (ref 0.0–100.0)

## 2016-01-01 MED ORDER — ONDANSETRON HCL 4 MG/2ML IJ SOLN: 4.0000 mg | Freq: Four times a day (QID) | INTRAMUSCULAR | Status: DC | PRN

## 2016-01-01 MED ORDER — CARVEDILOL 3.125 MG PO TABS
3.1250 mg | ORAL_TABLET | Freq: Two times a day (BID) | ORAL | Status: DC
  Administered 2016-01-01 – 2016-01-03 (×4): 3.125 mg via ORAL
  Filled 2016-01-01 (×5): qty 1

## 2016-01-01 MED ORDER — MORPHINE SULFATE (PF) 2 MG/ML IV SOLN
2.0000 mg | INTRAVENOUS | Status: DC | PRN
  Administered 2016-01-01: 4 mg via INTRAVENOUS
  Filled 2016-01-01: qty 2

## 2016-01-01 MED ORDER — SODIUM CHLORIDE 0.9 % WEIGHT BASED INFUSION: 1.0000 mL/kg/h | INTRAVENOUS | Status: DC

## 2016-01-01 MED ORDER — NITROGLYCERIN 0.4 MG SL SUBL
0.4000 mg | SUBLINGUAL_TABLET | SUBLINGUAL | Status: DC | PRN
Start: 2016-01-01 — End: 2016-01-03

## 2016-01-01 MED ORDER — ASPIRIN EC 81 MG PO TBEC
81.0000 mg | DELAYED_RELEASE_TABLET | Freq: Every day | ORAL | Status: DC
  Administered 2016-01-01: 81 mg via ORAL
  Filled 2016-01-01: qty 1

## 2016-01-01 MED ORDER — NITROGLYCERIN IN D5W 200-5 MCG/ML-% IV SOLN
0.0000 ug/min | Freq: Once | INTRAVENOUS | Status: AC
  Administered 2016-01-01: 5 ug/min via INTRAVENOUS
  Filled 2016-01-01: qty 250

## 2016-01-01 MED ORDER — HEPARIN BOLUS VIA INFUSION
3300.0000 [IU] | Freq: Once | INTRAVENOUS | Status: AC
  Administered 2016-01-01: 3300 [IU] via INTRAVENOUS
  Filled 2016-01-01: qty 3300

## 2016-01-01 MED ORDER — SODIUM CHLORIDE 0.9 % IV SOLN: 250.0000 mL | INTRAVENOUS | Status: DC | PRN

## 2016-01-01 MED ORDER — ATORVASTATIN CALCIUM 80 MG PO TABS
80.0000 mg | ORAL_TABLET | Freq: Every evening | ORAL | Status: DC
  Administered 2016-01-01 – 2016-01-02 (×2): 80 mg via ORAL
  Filled 2016-01-01 (×2): qty 1

## 2016-01-01 MED ORDER — ASPIRIN 81 MG PO CHEW
324.0000 mg | CHEWABLE_TABLET | ORAL | Status: AC
  Administered 2016-01-02: 324 mg via ORAL
  Filled 2016-01-01: qty 4

## 2016-01-01 MED ORDER — NITROGLYCERIN IN D5W 200-5 MCG/ML-% IV SOLN: 0.0000 ug/min | Freq: Once | INTRAVENOUS | Status: AC

## 2016-01-01 MED ORDER — SODIUM CHLORIDE 0.9% FLUSH: 3.0000 mL | INTRAVENOUS | Status: DC | PRN

## 2016-01-01 MED ORDER — HEPARIN (PORCINE) IN NACL 100-0.45 UNIT/ML-% IJ SOLN
550.0000 [IU]/h | INTRAMUSCULAR | Status: DC
  Administered 2016-01-01: 700 [IU]/h via INTRAVENOUS
  Filled 2016-01-01: qty 250

## 2016-01-01 MED ORDER — ALPRAZOLAM 0.25 MG PO TABS: 0.2500 mg | ORAL_TABLET | Freq: Two times a day (BID) | ORAL | Status: DC | PRN

## 2016-01-01 MED ORDER — TICAGRELOR 90 MG PO TABS
90.0000 mg | ORAL_TABLET | Freq: Two times a day (BID) | ORAL | Status: DC
  Administered 2016-01-01 – 2016-01-03 (×5): 90 mg via ORAL
  Filled 2016-01-01 (×5): qty 1

## 2016-01-01 MED ORDER — PANTOPRAZOLE SODIUM 40 MG PO TBEC
40.0000 mg | DELAYED_RELEASE_TABLET | Freq: Every day | ORAL | Status: DC
  Administered 2016-01-01 – 2016-01-03 (×3): 40 mg via ORAL
  Filled 2016-01-01 (×3): qty 1

## 2016-01-01 MED ORDER — SODIUM CHLORIDE 0.9% FLUSH
3.0000 mL | Freq: Two times a day (BID) | INTRAVENOUS | Status: DC
  Administered 2016-01-01: 3 mL via INTRAVENOUS

## 2016-01-01 MED ORDER — FUROSEMIDE 10 MG/ML IJ SOLN
30.0000 mg | Freq: Once | INTRAMUSCULAR | Status: AC
  Administered 2016-01-01: 30 mg via INTRAVENOUS
  Filled 2016-01-01: qty 4

## 2016-01-01 MED ORDER — MORPHINE SULFATE (PF) 2 MG/ML IV SOLN
2.0000 mg | Freq: Once | INTRAVENOUS | Status: AC
  Administered 2016-01-01: 2 mg via INTRAVENOUS
  Filled 2016-01-01: qty 1

## 2016-01-01 MED ORDER — ZOLPIDEM TARTRATE 5 MG PO TABS
5.0000 mg | ORAL_TABLET | Freq: Every evening | ORAL | Status: DC | PRN
  Administered 2016-01-02: 21:00:00 5 mg via ORAL
  Filled 2016-01-01: qty 1

## 2016-01-01 MED ORDER — SODIUM CHLORIDE 0.9 % WEIGHT BASED INFUSION
3.0000 mL/kg/h | INTRAVENOUS | Status: DC
  Administered 2016-01-02: 3 mL/kg/h via INTRAVENOUS

## 2016-01-01 MED ORDER — ACETAMINOPHEN 325 MG PO TABS
650.0000 mg | ORAL_TABLET | ORAL | Status: DC | PRN
Start: 2016-01-01 — End: 2016-01-03
  Administered 2016-01-01: 650 mg via ORAL
  Filled 2016-01-01: qty 2

## 2016-01-01 MED ORDER — ASPIRIN EC 81 MG PO TBEC
81.0000 mg | DELAYED_RELEASE_TABLET | Freq: Every day | ORAL | Status: DC
  Administered 2016-01-03: 10:00:00 81 mg via ORAL
  Filled 2016-01-01: qty 1

## 2016-01-01 NOTE — Progress Notes (Signed)
ANTICOAGULATION CONSULT NOTE - Follow Up Consult  Pharmacy Consult for heparin Indication: chest pain/ACS  No Known Allergies  Patient Measurements: Height: 5\' 2"  (157.5 cm) Weight: 121 lb (54.885 kg) IBW/kg (Calculated) : 54.6 Heparin Dosing Weight: 54.9 kg  Vital Signs: Temp: 98.6 F (37 C) (05/29 1953) Temp Source: Oral (05/29 1953) BP: 122/70 mmHg (05/29 1953) Pulse Rate: 79 (05/29 1953)  Labs:  Recent Labs  01/01/16 0738 01/01/16 1108 01/01/16 1630 01/01/16 2103  HGB 16.8  --   --   --   HCT 49.4  --   --   --   PLT 146*  --   --   --   LABPROT  --  13.5  --   --   INR  --  1.01  --   --   HEPARINUNFRC  --   --   --  0.73*  CREATININE 1.04  --   --   --   TROPONINI  --  0.04* 0.04*  --     Estimated Creatinine Clearance: 57.6 mL/min (by C-G formula based on Cr of 1.04).   Medical History: Past Medical History  Diagnosis Date  . ST elevation myocardial infarction involving right coronary artery (HCC) 07/05/2015  . CAD (coronary artery disease) 07/06/2015    a. 99% dRCA with 100% rPAV, long-tubular ~70% pLAD & mid D1 - PCI of dRCA-rPDA but unable to revascularize rPAV-RPL. LAD treated medically for now, considering further OP workup.  . Cardiomyopathy (HCC)     a. Echo 07/07/15: EF 30%, Inf Akinesis wtih Global HK, normal valves  . Chronic systolic CHF (congestive heart failure) (HCC)   . Hyperlipidemia   . Abnormal LFTs   . Tobacco abuse   . Polycythemia     Medications:  Scheduled:  . [START ON 01/02/2016] aspirin  324 mg Oral Pre-Cath  . [START ON 01/03/2016] aspirin EC  81 mg Oral Daily  . atorvastatin  80 mg Oral QPM  . carvedilol  3.125 mg Oral BID WC  . pantoprazole  40 mg Oral Daily  . sodium chloride flush  3 mL Intravenous Q12H  . ticagrelor  90 mg Oral BID    Assessment: 60 yo male presents 5/29 with SOB and chest pain. [facility] for same issue. Trop 0.04. Planning for cardiac catheterization per cards. Heparin  drip at 700 uts/hr, HL at top end of goal 0.73.     Goal of Therapy:  Heparin level 0.3-0.7 units/ml Monitor platelets by anticoagulation protocol: Yes   Plan:  Decrease Heparin drip 550 uts/hr  Check daily HL, CBC  Leota SauersLisa Airam Heidecker Pharm.D. CPP, BCPS Clinical Pharmacist [phone] 01/01/2016 9:46 PM

## 2016-01-01 NOTE — ED Notes (Signed)
Pt in EMS from home, CP started approx 9pm last night, took several NTG with relief and then pain returned. Also took 324 ASA. Pain is currently 2/10. Describes as tight. Reports SOB. Hx MI in November, had stents placed.

## 2016-01-01 NOTE — ED Notes (Signed)
Pt. Ambulated to bathroom at this time.  

## 2016-01-01 NOTE — ED Provider Notes (Signed)
CSN: [id number]     Arrival date & time 01/01/16  4098 History   First MD Initiated Contact with Patient 01/01/16 0701     Chief Complaint  Patient presents with  . Chest Pain     (Consider location/radiation/quality/duration/timing/severity/associated sxs/prior Treatment) HPI Comments: 60 year old male with history of CAD, status post MI in November 2016 with stenting of the RCA and with noted significant disease in LAD presents for chest pain. The patient reports that he has similar episode of chest pain on Friday and was seen at the [place] in [place] and admitted there until yesterday. He reports he was discharged on a new water pill. He said that he did not undergo any testing including stress test or cardiac cath. He reports that the pain is like a band across his chest and that he does have associated shortness of breath. Reports that it is hard to lay down flat because of shortness of breath and he feels better sitting up. Denies any leg swelling. He states he feels this is similar to when he had his heart attack in November.  He says that the pain returned last night at 9:30 PM and was significant at that time.   Past Medical History  Diagnosis Date  . ST elevation myocardial infarction involving right coronary artery (HCC) 07/05/2015  . CAD (coronary artery disease) 07/06/2015    a. 99% dRCA with 100% rPAV, long-tubular ~70% pLAD & mid D1 - PCI of dRCA-rPDA but unable to revascularize rPAV-RPL. LAD treated medically for now, considering further OP workup.  . Cardiomyopathy (HCC)     a. Echo 07/07/15: EF 30%, Inf Akinesis wtih Global HK, normal valves  . Chronic systolic CHF (congestive heart failure) (HCC)   . Hyperlipidemia   . Abnormal LFTs   . Tobacco abuse   . Polycythemia    Past Surgical History  Procedure Laterality Date  . Appendectomy    . Cardiac catheterization N/A 07/05/2015    Procedure: Left Heart Cath and Coronary Angiography;  Surgeon: Lennette Bihari, MD;   Location: [facility] INVASIVE CV LAB;  Service: Cardiovascular;  dRCA-rPDA 99%, 100% rPAV-PL (PCI of dRCA-rPDA), Ost-Prox LAD 70% (also mD1 ~70-80), EF 20%  . Cardiac catheterization N/A 07/05/2015    Procedure: Coronary Stent Intervention;  Surgeon: Lennette Bihari, MD;  Location: [facility] INVASIVE CV LAB;  Service: Cardiovascular;  Xience Alpine DES 3 x 38 (3.25-3.14 taper) dRCA-rPDA; jailed 100% occluded rPAV.  . Transthoracic echocardiogram  07/07/2015    Normal LV size with EF 30%. Diffuse hypokinesis with inferior akinesis. Normal RV size and systolic function. No significant valvular abnormalities.   Family History  Problem Relation Age of Onset  . Heart attack Neg Hx   . Hypertension Neg Hx   . Stroke Neg Hx    Social History  Substance Use Topics  . Smoking status: Former Smoker    Quit date: 09/06/2015  . Smokeless tobacco: None  . Alcohol Use: No    Review of Systems  Constitutional: Negative for fever, chills and fatigue.  HENT: Negative for congestion, nosebleeds, postnasal drip, rhinorrhea and sinus pressure.   Eyes: Negative for visual disturbance.  Respiratory: Negative for cough, chest tightness and shortness of breath.   Cardiovascular: Positive for chest pain. Negative for palpitations and leg swelling.  Gastrointestinal: Negative for nausea, vomiting, abdominal pain and diarrhea.  Genitourinary: Negative for dysuria, urgency and hematuria.  Musculoskeletal: Negative for myalgias and back pain.  Skin: Negative for rash.  Neurological: Negative for dizziness,  weakness and headaches.  Hematological: Does not bruise/bleed easily.      Allergies  Review of patient's allergies indicates no known allergies.  Home Medications   Prior to Admission medications   Medication Sig Start Date End Date Taking? Authorizing Provider  aspirin EC 81 MG EC tablet Take 1 tablet (81 mg total) by mouth daily. 07/08/15  Yes Dayna N Dunn, PA-C  atorvastatin (LIPITOR) 80 MG tablet Take 1 tablet  (80 mg total) by mouth every evening. 07/19/15  Yes Brittainy Sherlynn CarbonM Simmons, PA-C  carvedilol (COREG) 3.125 MG tablet Take 1 tablet (3.125 mg total) by mouth 2 (two) times daily with a meal. 07/19/15  Yes Brittainy Sherlynn CarbonM Simmons, PA-C  furosemide (LASIX) 20 MG tablet Take 20 mg by mouth 2 (two) times daily.   Yes Historical Provider, MD  nitroGLYCERIN (NITROSTAT) 0.4 MG SL tablet Place 1 tablet (0.4 mg total) under the tongue every 5 (five) minutes as needed for chest pain (up to 3 doses). 07/19/15  Yes Brittainy Sherlynn CarbonM Simmons, PA-C  omeprazole (PRILOSEC) 20 MG capsule Take 40 mg by mouth daily.   Yes Historical Provider, MD  ticagrelor (BRILINTA) 90 MG TABS tablet Take 1 tablet (90 mg total) by mouth 2 (two) times daily. 07/19/15  Yes Brittainy M Simmons, PA-C   BP 115/74 mmHg  Pulse 66  Temp(Src) 99.2 F (37.3 C) (Oral)  Resp 19  SpO2 96% Physical Exam  Constitutional: He is oriented to person, place, and time. He appears well-developed and well-nourished. No distress.  HENT:  Head: Normocephalic and atraumatic.  Right Ear: External ear normal.  Left Ear: External ear normal.  Mouth/Throat: Oropharynx is clear and moist. No oropharyngeal exudate.  Eyes: EOM are normal. Pupils are equal, round, and reactive to light.  Neck: Normal range of motion. Neck supple.  Cardiovascular: Normal rate, regular rhythm, normal heart sounds and intact distal pulses.   No murmur heard. Pulmonary/Chest: Effort normal. No respiratory distress. He has no wheezes. He has no rales.  Abdominal: Soft. He exhibits no distension. There is no tenderness.  Musculoskeletal: He exhibits no edema.  Neurological: He is alert and oriented to person, place, and time.  Skin: Skin is warm and dry. No rash noted. He is not diaphoretic.  Vitals reviewed.   ED Course  Procedures (including critical care time) Labs Review Labs Reviewed  BASIC METABOLIC PANEL - Abnormal; Notable for the following:    CO2 21 (*)    Glucose, Bld  121 (*)    Calcium 8.2 (*)    All other components within normal limits  CBC - Abnormal; Notable for the following:    Platelets 146 (*)    All other components within normal limits  BRAIN NATRIURETIC PEPTIDE - Abnormal; Notable for the following:    B Natriuretic Peptide 161.6 (*)    All other components within normal limits  TROPONIN I - Abnormal; Notable for the following:    Troponin I 0.04 (*)    All other components within normal limits  PROTIME-INR  TROPONIN I  TROPONIN I  D-DIMER, QUANTITATIVE (NOT AT [facility])  Rosezena SensorI-STAT TROPOININ, ED    Imaging Review Dg Chest 2 View  01/01/2016  CLINICAL DATA:  Chest pain EXAM: CHEST  2 VIEW COMPARISON:  07/05/2015 FINDINGS: The heart size and mediastinal contours are within normal limits. Both lungs are clear. The visualized skeletal structures are unremarkable. IMPRESSION: No active cardiopulmonary disease. Electronically Signed   By: Alcide CleverMark  Lukens M.D.   On: 01/01/2016 07:13  I have personally reviewed and evaluated these images and lab results as part of my medical decision-making.   EKG Interpretation   Date/Time:  Monday Jan 01 2016 06:39:06 EDT Ventricular Rate:  80 PR Interval:  114 QRS Duration: 105 QT Interval:  421 QTC Calculation: 486 R Axis:   -54 Text Interpretation:  Sinus rhythm Ventricular premature complex  Borderline short PR interval Right atrial enlargement Probable inferior  infarct, old Consider anterior infarct V3 with change in ST segment  morphology Confirmed by NGUYEN, EMILY ([id number]) on 01/01/2016 7:10:31 AM Also  confirmed by Cyndie Chime, EMILY ([id number]), editor [place], Jola Babinski [phone])  on  01/01/2016 9:47:23 AM      MDM  Patient was seen and evaluated in stable condition. Patient was started on a low nitro drip and given morphine for his chest pain. He did report on initial reevaluation at that pain had subsided. EKG without acute ischemic changes. Troponin unremarkable. Concern for possible unstable angina.  Patient with known coronary artery disease. Cardiology was consultative who saw the patient bedside and admitted him under their care for further management and treatment. Final diagnoses:  None    1. Acute coronary syndrome, unstable angina    Leta [facility], MD 01/01/16 1151

## 2016-01-01 NOTE — H&P (Signed)
History and Physical   Patient ID: Jason Barry MRN: [id number], DOB/AGE: 12/08/1955 60 y.o. Date of Encounter: 01/01/2016  Primary Physician: No PCP Per Patient Primary Cardiologist: Dr Tresa Endo  Chief Complaint:  Chest pain  HPI: Jason Barry is a 60 y.o. male with a history of CAD treated medically, cath 07/2015 with DES dRCA-rPDA, but unable to revascularize rPAV-RPL so this and 70% LAD treated medically. EF 30%.  Jason Barry has been getting more dyspnea on exertion and chest pain for several months. The symptoms worsened to the point that he went to the [facility] hospital and was admitted on 05/26. He was discharged on 05/28. He had no testing done and the only medication change was Lasix 20 mg twice a day with juice or banana for potassium.  Last p.m., after he got home, he was short of breath, and describes orthopnea. He could not get comfortable because he could not breathe whenever he tried to lie down. He was having chest tightness at a 5/10. His usual angina. When his symptoms did not improve, he came to the emergency room.  In the emergency room, he has had Lasix 30 mg IV, morphine 2 mg, and had IV nitroglycerin at 5 mcg/m started. He is still describing orthopnea and he is still having chest tightness at a 5/10. Blood pressure is approximately 100 systolic, limiting up titration of his nitroglycerin.   Past Medical History  Diagnosis Date  . ST elevation myocardial infarction involving right coronary artery (HCC) 07/05/2015  . CAD (coronary artery disease) 07/06/2015    a. 99% dRCA with 100% rPAV, long-tubular ~70% pLAD & mid D1 - PCI of dRCA-rPDA but unable to revascularize rPAV-RPL. LAD treated medically for now, considering further OP workup.  . Cardiomyopathy (HCC)     a. Echo 07/07/15: EF 30%, Inf Akinesis wtih Global HK, normal valves  . Chronic systolic CHF (congestive heart failure) (HCC)   . Hyperlipidemia   . Abnormal LFTs   . Tobacco abuse   . Polycythemia     Surgical History:  Past Surgical History  Procedure Laterality Date  . Appendectomy    . Cardiac catheterization N/A 07/05/2015    Procedure: Left Heart Cath and Coronary Angiography;  Surgeon: Lennette Bihari, MD;  Location: [facility] INVASIVE CV LAB;  Service: Cardiovascular;  dRCA-rPDA 99%, 100% rPAV-PL (PCI of dRCA-rPDA), Ost-Prox LAD 70% (also mD1 ~70-80), EF 20%  . Cardiac catheterization N/A 07/05/2015    Procedure: Coronary Stent Intervention;  Surgeon: Lennette Bihari, MD;  Location: [facility] INVASIVE CV LAB;  Service: Cardiovascular;  Xience Alpine DES 3 x 38 (3.25-3.14 taper) dRCA-rPDA; jailed 100% occluded rPAV.  . Transthoracic echocardiogram  07/07/2015    Normal LV size with EF 30%. Diffuse hypokinesis with inferior akinesis. Normal RV size and systolic function. No significant valvular abnormalities.     I have reviewed the patient's current medications. Prior to Admission medications   Medication Sig Start Date End Date Taking? Authorizing Provider  aspirin EC 81 MG EC tablet Take 1 tablet (81 mg total) by mouth daily. 07/08/15  Yes Dayna N Dunn, PA-C  atorvastatin (LIPITOR) 80 MG tablet Take 1 tablet (80 mg total) by mouth every evening. 07/19/15  Yes Brittainy Sherlynn Carbon, PA-C  carvedilol (COREG) 3.125 MG tablet Take 1 tablet (3.125 mg total) by mouth 2 (two) times daily with a meal. 07/19/15  Yes Brittainy Sherlynn Carbon, PA-C  furosemide (LASIX) 20 MG tablet Take 20 mg by mouth 2 (two) times  daily.   Yes Historical Provider, MD  nitroGLYCERIN (NITROSTAT) 0.4 MG SL tablet Place 1 tablet (0.4 mg total) under the tongue every 5 (five) minutes as needed for chest pain (up to 3 doses). 07/19/15  Yes Brittainy Sherlynn Carbon, PA-C  omeprazole (PRILOSEC) 20 MG capsule Take 40 mg by mouth daily.   Yes Historical Provider, MD  ticagrelor (BRILINTA) 90 MG TABS tablet Take 1 tablet (90 mg total) by mouth 2 (two) times daily. 07/19/15  Yes Brittainy Sherlynn Carbon, PA-C   Scheduled Meds: Continuous  Infusions: PRN Meds:.  Allergies: No Known Allergies  Social History   Social History  . Marital Status: Single    Spouse Name: N/A  . Number of Children: N/A  . Years of Education: N/A   Occupational History  . Not on file.   Social History Main Topics  . Smoking status: Former Smoker    Quit date: 09/06/2015  . Smokeless tobacco: Not on file  . Alcohol Use: No  . Drug Use: Not on file  . Sexual Activity: Not on file   Other Topics Concern  . Not on file   Social History Narrative   Lives in [place] with wife.    Family History  Problem Relation Age of Onset  . Heart attack Neg Hx   . Hypertension Neg Hx   . Stroke Neg Hx    Family Status  Relation Status Death Age  . Mother Deceased   . Father Deceased     Review of Systems:   Full 14-point review of systems otherwise negative except as noted above.  Physical Exam: Blood pressure 118/80, pulse 57, temperature 99.2 F (37.3 C), temperature source Oral, resp. rate 18, SpO2 96 %. General: Well developed, well nourished,male in no acute distress. Head: Normocephalic, atraumatic, sclera non-icteric, no xanthomas, nares are without discharge. Dentition: good Neck: No carotid bruits. JVD not elevated. No thyromegally Lungs: Good expansion bilaterally. without wheezes or rhonchi.  Heart: Regular rate and rhythm with S1 S2.  No S3 or S4.  No murmur, no rubs, or gallops appreciated. Abdomen: Soft, non-tender, non-distended with normoactive bowel sounds. No hepatomegaly. No rebound/guarding. No obvious abdominal masses. Msk:  Strength and tone appear normal for age. No joint deformities or effusions, no spine or costo-vertebral angle tenderness. Extremities: No clubbing or cyanosis. No edema.  Distal pedal pulses are 2+ in 4 extrem Neuro: Alert and oriented X 3. Moves all extremities spontaneously. No focal deficits noted. Psych:  Responds to questions appropriately with a normal affect. Skin: No rashes or lesions  noted  Labs:   Lab Results  Component Value Date   WBC 9.8 01/01/2016   HGB 16.8 01/01/2016   HCT 49.4 01/01/2016   MCV 89.3 01/01/2016   PLT 146* 01/01/2016   No results for input(s): INR in the last 72 hours.   Recent Labs Lab 01/01/16 0738  NA 136  K 3.6  CL 104  CO2 21*  BUN 9  CREATININE 1.04  CALCIUM 8.2*  GLUCOSE 121*   No results for input(s): CKTOTAL, CKMB, TROPONINI in the last 72 hours.  Recent Labs  01/01/16 0740  TROPIPOC 0.04   Lab Results  Component Value Date   CHOL 221* 07/07/2015   HDL 38* 07/07/2015   LDLCALC 160* 07/07/2015   TRIG 117 07/07/2015   B NATRIURETIC PEPTIDE  Date/Time Value Ref Range Status  01/01/2016 07:38 AM 161.6* 0.0 - 100.0 pg/mL Final   Radiology/Studies: Dg Chest 2 View 01/01/2016  CLINICAL  DATA:  Chest pain EXAM: CHEST  2 VIEW COMPARISON:  07/05/2015 FINDINGS: The heart size and mediastinal contours are within normal limits. Both lungs are clear. The visualized skeletal structures are unremarkable. IMPRESSION: No active cardiopulmonary disease. Electronically Signed   By: Alcide Clever M.D.   On: 01/01/2016 07:13   Myoview: 08/02/2015 Overall Study Impression Myocardial perfusion is abnormal. Findings consistent with prior myocardial infarction. This is an intermediate risk study. Overall left ventricular systolic function was abnormal. LV cavity size is moderately enlarged. Nuclear stress EF: 35%. The left ventricular ejection fraction is moderately decreased (30-44%). There is no prior study for comparison.   Cardiac Cath: 07/05/2015  Ost LAD to Prox LAD lesion, 70% stenosed.  Post Atrio lesion, 100% stenosed.  Dist RCA-1 lesion, 50% stenosed.  Dist RCA-2 lesion, 99% stenosed. Post intervention, there is a 0% residual stenosis.  RPDA lesion, 50% stenosed. Post intervention, there is a 0% residual stenosis.  There is severe left ventricular systolic dysfunction. Dilated left ventricle with severe global LV  dysfunction, ejection fraction of less than 20%. Two vessel coronary obstructive disease with 70% smooth tubular eccentric ostial LAD stenosis; normal left circumflex: Artery, and RCA with diffuse distal disease with 50, 99, 50% stenoses with total occlusion of the PLA branch.   Successful PCI to the distal RCA with PTCA/DES stenting with a 3.038 mm Xience Alpine stent postdilated to 3.24 down to 3.14 mm with the entire region of stenoses being reduced to 0%. At the completion of the procedure there was am TIMI 1 flow down the PLA branch. There was brisk TIMI-3 flow down the RCA.  Echo: 07/06/2015 - Left ventricle: The cavity size was normal. Wall thickness was  normal. The estimated ejection fraction was 30%. Diffuse  hypokinesis with inferior akinesis. Doppler parameters are  consistent with abnormal left ventricular relaxation (grade 1  diastolic dysfunction). - Aortic valve: There was no stenosis. - Mitral valve: Mildly calcified annulus. Mildly calcified leaflets  . There was trivial regurgitation. - Left atrium: The atrium was mildly dilated. - Right ventricle: The cavity size was normal. Systolic function  was normal. - Pulmonary arteries: No complete TR doppler jet so unable to  estimate PA systolic pressure. - Inferior vena cava: The vessel was normal in size. The  respirophasic diameter changes were in the normal range (>= 50%),  consistent with normal central venous pressure. Impressions: - Normal LV size with EF 30%. Diffuse hypokinesis with inferior  akinesis. Normal RV size and systolic function. No significant  valvular abnormalities.  ECG: 05/29 SR, PACs  No sig change from 07/2015  ASSESSMENT AND PLAN:  Principal Problem:   Chest pain with high risk for cardiac etiology - continue IV NTG as BP will allow - morphine for pain - continue ASA, BB (if BP will allow), statin - not on ACE due to hypotension  Active Problems:   Presence of drug coated  stent in right coronary artery - continue ASA, BB, Brilinta    Cardiomyopathy, ischemic; EF 30% (out of proportion to CAD) - no sx volume overload by exam - BNP not severely elevated    Hyperlipidemia with target LDL less than 70; Poorly controlled - continue high-dose statin, recheck profile and LFTs    SOB - cause unclear as no sig volume overload on exam - Per patient, no significant risk factors for PE    Signed, Theodore Demark, PA-C 01/01/2016 10:53 AM Beeper [phone]  As above; pt seen and examined; briefly, he is a  60 yo male With past medical history of coronary artery disease with chest pain and dyspnea. Patient had PCI of his right coronary artery in December 2016 and has residual 70% LAD lesion.  Patient states he has had intermittent chest tightness and dyspnea since his intervention. This occurs both with activities and at rest. Patient states that he has had worsening symptoms over the weekend and was seen at the [facility].  He was given Lasix and discharged. He has had dyspnea and chest tightness since 9:30 last evening. This chest tightness increases with inspiration. Electrocardiogram shows sinus rhythm, right atrial enlargement, PACs, prior inferior infarct, cannot rule out anterior infarct.  History somewhat difficult. However patient has had continuing dyspnea and chest pain since his previous PCI. We will arrange cardiac catheterization for definitive evaluation. The risks and benefits were discussed and he agrees to proceed. Continue preadmission medications. Check d-dimer. Will Not diurese further. I do not think he is volume overloaded on examination. We will try an ACE inhibitor given reduced LV function later following catheterization if blood pressure allows. Olga MillersBrian Delayne Sanzo

## 2016-01-01 NOTE — ED Notes (Signed)
Pt ambulated to the bathroom independently and stated that he had a bowel movement and also urinated.

## 2016-01-01 NOTE — ED Notes (Signed)
EDP at bedside  

## 2016-01-01 NOTE — Progress Notes (Signed)
ANTICOAGULATION CONSULT NOTE - Initial Consult  Pharmacy Consult for heparin Indication: chest pain/ACS  No Known Allergies  Patient Measurements: Height: 5\' 2"  (157.5 cm) Weight: 121 lb (54.885 kg) IBW/kg (Calculated) : 54.6 Heparin Dosing Weight: 54.9 kg  Vital Signs: Temp: 98.4 F (36.9 C) (05/29 1353) Temp Source: Oral (05/29 1353) BP: 113/91 mmHg (05/29 1353) Pulse Rate: 94 (05/29 1353)  Labs:  Recent Labs  01/01/16 0738 01/01/16 1108  HGB 16.8  --   HCT 49.4  --   PLT 146*  --   LABPROT  --  13.5  INR  --  1.01  CREATININE 1.04  --   TROPONINI  --  0.04*    Estimated Creatinine Clearance: 57.6 mL/min (by C-G formula based on Cr of 1.04).   Medical History: Past Medical History  Diagnosis Date  . ST elevation myocardial infarction involving right coronary artery (HCC) 07/05/2015  . CAD (coronary artery disease) 07/06/2015    a. 99% dRCA with 100% rPAV, long-tubular ~70% pLAD & mid D1 - PCI of dRCA-rPDA but unable to revascularize rPAV-RPL. LAD treated medically for now, considering further OP workup.  . Cardiomyopathy (HCC)     a. Echo 07/07/15: EF 30%, Inf Akinesis wtih Global HK, normal valves  . Chronic systolic CHF (congestive heart failure) (HCC)   . Hyperlipidemia   . Abnormal LFTs   . Tobacco abuse   . Polycythemia     Medications:  Scheduled:  . [START ON 01/02/2016] aspirin  324 mg Oral Pre-Cath  . [START ON 01/03/2016] aspirin EC  81 mg Oral Daily  . atorvastatin  80 mg Oral QPM  . carvedilol  3.125 mg Oral BID WC  . pantoprazole  40 mg Oral Daily  . sodium chloride flush  3 mL Intravenous Q12H  . ticagrelor  90 mg Oral BID    Assessment: 60 yo male presents 5/29 with SOB and chest pain. [facility] for same issue. Trop 0.04. Planning for cardiac catheterization per cards.   Goal of Therapy:  Heparin level 0.3-0.7 units/ml Monitor platelets by anticoagulation protocol: Yes   Plan:  Give 3300 units bolus x  1 Start heparin infusion at 700 units/hr Check anti-Xa level in 6 hours and daily while on heparin Continue to monitor H&H and platelets  Greggory Stallionristy Reyes, PharmD Clinical Pharmacy Resident Pager # [phone] 01/01/2016 2:30 PM

## 2016-01-01 NOTE — ED Notes (Signed)
Cardiology at bedside.

## 2016-01-01 NOTE — ED Notes (Signed)
Nitro drip stopped at this time because of low blood pressure.

## 2016-01-01 NOTE — ED Notes (Signed)
Pt. Asleep so RN will hold pain meds at this time.

## 2016-01-02 ENCOUNTER — Encounter (HOSPITAL_COMMUNITY)
Admission: EM | Disposition: A | Discharge status: Home / Self Care | Payer: Self-pay | Source: Home / Self Care | Attending: Cardiology

## 2016-01-02 DIAGNOSIS — I200 Unstable angina: Secondary | ICD-10-CM | POA: Insufficient documentation

## 2016-01-02 DIAGNOSIS — I255 Ischemic cardiomyopathy: Secondary | ICD-10-CM

## 2016-01-02 DIAGNOSIS — E876 Hypokalemia: Secondary | ICD-10-CM

## 2016-01-02 DIAGNOSIS — I25110 Atherosclerotic heart disease of native coronary artery with unstable angina pectoris: Principal | ICD-10-CM

## 2016-01-02 DIAGNOSIS — E785 Hyperlipidemia, unspecified: Secondary | ICD-10-CM

## 2016-01-02 HISTORY — PX: CARDIAC CATHETERIZATION: SHX172

## 2016-01-02 LAB — COMPREHENSIVE METABOLIC PANEL
ALBUMIN: 3.2 g/dL — AB (ref 3.5–5.0)
ALK PHOS: 62 U/L (ref 38–126)
ALT: 25 U/L (ref 17–63)
ANION GAP: 9 (ref 5–15)
AST: 41 U/L (ref 15–41)
BUN: 8 mg/dL (ref 6–20)
CHLORIDE: 104 mmol/L (ref 101–111)
CO2: 24 mmol/L (ref 22–32)
Calcium: 8.6 mg/dL — ABNORMAL LOW (ref 8.9–10.3)
Creatinine, Ser: 0.98 mg/dL (ref 0.61–1.24)
GFR calc Af Amer: >60 mL/min (ref >60)
GFR calc non Af Amer: >60 mL/min (ref >60)
GLUCOSE: 104 mg/dL — AB (ref 65–99)
POTASSIUM: 3.4 mmol/L — AB (ref 3.5–5.1)
SODIUM: 137 mmol/L (ref 135–145)
Total Bilirubin: 1.5 mg/dL — ABNORMAL HIGH (ref 0.3–1.2)
Total Protein: 5.9 g/dL — ABNORMAL LOW (ref 6.5–8.1)

## 2016-01-02 LAB — LIPID PANEL
CHOL/HDL RATIO: 4.1 ratio
Cholesterol: 124 mg/dL (ref 0–200)
HDL: 30 mg/dL — ABNORMAL LOW (ref >40)
LDL Cholesterol: 72 mg/dL (ref 0–99)
Triglycerides: 112 mg/dL (ref <150)
VLDL: 22 mg/dL (ref 0–40)

## 2016-01-02 LAB — HEMOGLOBIN A1C
HEMOGLOBIN A1C: 6.7 % — AB (ref 4.8–5.6)
Mean Plasma Glucose: 146 mg/dL

## 2016-01-02 LAB — CBC
HEMATOCRIT: 51.4 % (ref 39.0–52.0)
HEMOGLOBIN: 17.4 g/dL — AB (ref 13.0–17.0)
MCH: 30.3 pg (ref 26.0–34.0)
MCHC: 33.9 g/dL (ref 30.0–36.0)
MCV: 89.5 fL (ref 78.0–100.0)
Platelets: 134 10*3/uL — ABNORMAL LOW (ref 150–400)
RBC: 5.74 MIL/uL (ref 4.22–5.81)
RDW: 14.2 % (ref 11.5–15.5)
WBC: 8.1 10*3/uL (ref 4.0–10.5)

## 2016-01-02 LAB — C DIFFICILE QUICK SCREEN W PCR REFLEX
C DIFFICILE (CDIFF) INTERP: NEGATIVE
C DIFFICILE (CDIFF) TOXIN: NEGATIVE
C Diff antigen: NEGATIVE

## 2016-01-02 LAB — HEPARIN LEVEL (UNFRACTIONATED): Heparin Unfractionated: 0.49 IU/mL (ref 0.30–0.70)

## 2016-01-02 LAB — POCT ACTIVATED CLOTTING TIME: ACTIVATED CLOTTING TIME: 605 s

## 2016-01-02 LAB — OCCULT BLOOD X 1 CARD TO LAB, STOOL: FECAL OCCULT BLD: POSITIVE — AB

## 2016-01-02 SURGERY — LEFT HEART CATH AND CORONARY ANGIOGRAPHY
Anesthesia: LOCAL

## 2016-01-02 MED ORDER — BIVALIRUDIN 250 MG IV SOLR
INTRAVENOUS | Status: AC
  Filled 2016-01-02: qty 250

## 2016-01-02 MED ORDER — SODIUM CHLORIDE 0.9 % WEIGHT BASED INFUSION: 1.0000 mL/kg/h | INTRAVENOUS | Status: AC

## 2016-01-02 MED ORDER — SODIUM CHLORIDE 0.9% FLUSH: 3.0000 mL | INTRAVENOUS | Status: DC | PRN

## 2016-01-02 MED ORDER — HEPARIN (PORCINE) IN NACL 2-0.9 UNIT/ML-% IJ SOLN
INTRAMUSCULAR | Status: DC | PRN
  Administered 2016-01-02: 1000 mL

## 2016-01-02 MED ORDER — VERAPAMIL HCL 2.5 MG/ML IV SOLN
INTRAVENOUS | Status: AC
  Filled 2016-01-02: qty 2

## 2016-01-02 MED ORDER — SODIUM CHLORIDE 0.9 % IV SOLN: 250.0000 mL | INTRAVENOUS | Status: DC | PRN

## 2016-01-02 MED ORDER — HEPARIN (PORCINE) IN NACL 2-0.9 UNIT/ML-% IJ SOLN
INTRAMUSCULAR | Status: AC
  Filled 2016-01-02: qty 1000

## 2016-01-02 MED ORDER — MIDAZOLAM HCL 2 MG/2ML IJ SOLN
INTRAMUSCULAR | Status: AC
  Filled 2016-01-02: qty 2

## 2016-01-02 MED ORDER — ANGIOPLASTY BOOK
Freq: Once | Status: AC
  Administered 2016-01-02: 22:00:00
  Filled 2016-01-02: qty 1

## 2016-01-02 MED ORDER — LIDOCAINE HCL (PF) 1 % IJ SOLN
INTRAMUSCULAR | Status: AC
  Filled 2016-01-02: qty 30

## 2016-01-02 MED ORDER — MIDAZOLAM HCL 2 MG/2ML IJ SOLN
INTRAMUSCULAR | Status: DC | PRN
  Administered 2016-01-02: 2 mg via INTRAVENOUS

## 2016-01-02 MED ORDER — NITROGLYCERIN 1 MG/10 ML FOR IR/CATH LAB
INTRA_ARTERIAL | Status: AC
  Filled 2016-01-02: qty 10

## 2016-01-02 MED ORDER — IOPAMIDOL (ISOVUE-370) INJECTION 76%
INTRAVENOUS | Status: AC
  Filled 2016-01-02: qty 100

## 2016-01-02 MED ORDER — POTASSIUM CHLORIDE CRYS ER 20 MEQ PO TBCR
40.0000 meq | EXTENDED_RELEASE_TABLET | Freq: Once | ORAL | Status: AC
  Administered 2016-01-02: 40 meq via ORAL
  Filled 2016-01-02: qty 2

## 2016-01-02 MED ORDER — SODIUM CHLORIDE 0.9 % IV SOLN
250.0000 mg | INTRAVENOUS | Status: DC | PRN
  Administered 2016-01-02: 1.75 mg/kg/h via INTRAVENOUS

## 2016-01-02 MED ORDER — LIVING BETTER WITH HEART FAILURE BOOK
Freq: Once | Status: AC
  Administered 2016-01-02: 22:00:00

## 2016-01-02 MED ORDER — HEPARIN SODIUM (PORCINE) 1000 UNIT/ML IJ SOLN
INTRAMUSCULAR | Status: AC
  Filled 2016-01-02: qty 1

## 2016-01-02 MED ORDER — GUAIFENESIN 100 MG/5ML PO SOLN
5.0000 mL | ORAL | Status: DC | PRN
  Administered 2016-01-02 – 2016-01-03 (×3): 100 mg via ORAL
  Filled 2016-01-02 (×3): qty 5

## 2016-01-02 MED ORDER — FENTANYL CITRATE (PF) 100 MCG/2ML IJ SOLN
INTRAMUSCULAR | Status: AC
  Filled 2016-01-02: qty 2

## 2016-01-02 MED ORDER — BIVALIRUDIN BOLUS VIA INFUSION - CUPID
INTRAVENOUS | Status: DC | PRN
  Administered 2016-01-02: 41.25 mg via INTRAVENOUS

## 2016-01-02 MED ORDER — IOPAMIDOL (ISOVUE-370) INJECTION 76%
INTRAVENOUS | Status: DC | PRN
  Administered 2016-01-02: 130 mL via INTRAVENOUS

## 2016-01-02 MED ORDER — LIDOCAINE HCL (PF) 1 % IJ SOLN
INTRAMUSCULAR | Status: DC | PRN
  Administered 2016-01-02: 2 mL via INTRADERMAL

## 2016-01-02 MED ORDER — SODIUM CHLORIDE 0.9% FLUSH
3.0000 mL | Freq: Two times a day (BID) | INTRAVENOUS | Status: DC
  Administered 2016-01-02: 3 mL via INTRAVENOUS

## 2016-01-02 MED ORDER — HEPARIN SODIUM (PORCINE) 1000 UNIT/ML IJ SOLN
INTRAMUSCULAR | Status: DC | PRN
  Administered 2016-01-02: 3000 [IU] via INTRAVENOUS

## 2016-01-02 MED ORDER — VERAPAMIL HCL 2.5 MG/ML IV SOLN
INTRAVENOUS | Status: DC | PRN
  Administered 2016-01-02: 10 mL via INTRA_ARTERIAL

## 2016-01-02 MED ORDER — ADENOSINE (DIAGNOSTIC) 140MCG/KG/MIN
INTRAVENOUS | Status: DC | PRN
  Administered 2016-01-02: 140 ug/kg/min via INTRAVENOUS

## 2016-01-02 MED ORDER — ADENOSINE 12 MG/4ML IV SOLN
12.0000 mL | Freq: Once | INTRAVENOUS | Status: DC
  Filled 2016-01-02: qty 12

## 2016-01-02 MED ORDER — NITROGLYCERIN 1 MG/10 ML FOR IR/CATH LAB
INTRA_ARTERIAL | Status: DC | PRN
  Administered 2016-01-02 (×2): 200 ug via INTRACORONARY

## 2016-01-02 MED ORDER — FENTANYL CITRATE (PF) 100 MCG/2ML IJ SOLN
INTRAMUSCULAR | Status: DC | PRN
  Administered 2016-01-02: 25 ug via INTRAVENOUS

## 2016-01-02 SURGICAL SUPPLY — 20 items
BALLN EUPHORA RX 2.25X12 (BALLOONS) ×2
BALLN ~~LOC~~ TREK RX 2.5X8 (BALLOONS) ×2
BALLOON EUPHORA RX 2.25X12 (BALLOONS) ×1 IMPLANT
BALLOON ~~LOC~~ TREK RX 2.5X8 (BALLOONS) ×1 IMPLANT
CATH INFINITI 5 FR JL3.5 (CATHETERS) ×2 IMPLANT
CATH INFINITI 5FR ANG PIGTAIL (CATHETERS) ×2 IMPLANT
CATH INFINITI JR4 5F (CATHETERS) ×2 IMPLANT
CATH VISTA GUIDE 6FR XBLAD3.5 (CATHETERS) ×2 IMPLANT
DEVICE RAD COMP TR BAND LRG (VASCULAR PRODUCTS) ×2 IMPLANT
GLIDESHEATH SLEND SS 6F .021 (SHEATH) ×2 IMPLANT
GUIDEWIRE PRESSURE COMET II (WIRE) ×2 IMPLANT
KIT ENCORE 26 ADVANTAGE (KITS) ×2 IMPLANT
KIT HEART LEFT (KITS) ×2 IMPLANT
PACK CARDIAC CATHETERIZATION (CUSTOM PROCEDURE TRAY) ×2 IMPLANT
STENT PROMUS PREM MR 2.5X24 (Permanent Stent) ×2 IMPLANT
SYR MEDRAD MARK V 150ML (SYRINGE) ×2 IMPLANT
TRANSDUCER W/STOPCOCK (MISCELLANEOUS) ×2 IMPLANT
TUBING CIL FLEX 10 FLL-RA (TUBING) ×2 IMPLANT
WIRE ASAHI PROWATER 180CM (WIRE) ×2 IMPLANT
WIRE SAFE-T 1.5MM-J .035X260CM (WIRE) ×2 IMPLANT

## 2016-01-02 NOTE — Progress Notes (Signed)
TR BAND REMOVAL  LOCATION:    right radial  DEFLATED PER PROTOCOL:    Yes.    TIME BAND OFF / DRESSING APPLIED:    1900   SITE UPON ARRIVAL:    Level 0  SITE AFTER BAND REMOVAL:    Level 1 ( small bruise)  CIRCULATION SENSATION AND MOVEMENT:    Within Normal Limits   Yes.    COMMENTS:   Small hematoma noted initially post air deflation, manual pressure applied, hematoma resolve, tolerated procedure well. Site level 1 ( small bruise)

## 2016-01-02 NOTE — Progress Notes (Signed)
    Subjective:  Denies CP; dyspnea improving   Objective:  Filed Vitals:   01/01/16 1353 01/01/16 1953 01/02/16 0025 01/02/16 0546  BP: 113/91 122/70 106/53 131/89  Pulse: 94 79 74 83  Temp: 98.4 F (36.9 C) 98.6 F (37 C) 99.8 F (37.7 C) 98.2 F (36.8 C)  TempSrc: Oral Oral Oral Oral  Resp: 22     Height: 5\' 2"  (1.575 m)     Weight: 121 lb (54.885 kg)   121 lb 3.2 oz (54.976 kg)  SpO2: 97% 97% 94% 95%    Intake/Output from previous day:  Intake/Output Summary (Last 24 hours) at 01/02/16 0850 Last data filed at 01/01/16 2000  Gross per 24 hour  Intake  385.9 ml  Output      0 ml  Net  385.9 ml    Physical Exam: Physical exam: Well-developed well-nourished in no acute distress.  Skin is warm and dry.  HEENT is normal.  Neck is supple.  Chest is clear to auscultation with normal expansion.  Cardiovascular exam is regular rate and rhythm.  Abdominal exam nontender or distended. No masses palpated. Extremities show no edema. neuro grossly intact    Lab Results: Basic Metabolic Panel:  Recent Labs  16/05/9604/29/17 0738 01/01/16 1630 01/02/16 0527  NA 136  --  137  K 3.6  --  3.4*  CL 104  --  104  CO2 21*  --  24  GLUCOSE 121*  --  104*  BUN 9  --  8  CREATININE 1.04  --  0.98  CALCIUM 8.2*  --  8.6*  MG  --  1.7  --    CBC:  Recent Labs  01/01/16 0738 01/02/16 0527  WBC 9.8 8.1  HGB 16.8 17.4*  HCT 49.4 51.4  MCV 89.3 89.5  PLT 146* 134*   Cardiac Enzymes:  Recent Labs  01/01/16 1108 01/01/16 1630 01/01/16 2230  TROPONINI 0.04* 0.04* 0.04*     Assessment/Plan:  1 unstable angina-patient is now pain-free. Enzymes are not consistent with an acute coronary syndrome. Continue aspirin, heparin, beta blockade and statin. Proceed with cardiac catheterization. The risks and benefits were discussed and he agrees to proceed. 2 ischemic cardiomyopathy-continue beta blocker. Will add ACE inhibitor prior to discharge if blood pressure allows. 3  hyperlipidemia-continue statin. 4 hypokalemia-supplement. 5 CAD-s/p recent PCI of RCA and residual LAD disease-continue ASA, statin and brilinta.  Olga MillersBrian Crenshaw 01/02/2016, 8:50 AM

## 2016-01-02 NOTE — Interval H&P Note (Signed)
History and Physical Interval Note:  01/02/2016 1:02 PM  Jason Barry  has presented today for surgery, with the diagnosis of unstable angina  The various methods of treatment have been discussed with the patient and family. After consideration of risks, benefits and other options for treatment, the patient has consented to  Procedure(s): Left Heart Cath and Coronary Angiography (N/A) as a surgical intervention .  The patient's history has been reviewed, patient examined, no change in status, stable for surgery.  I have reviewed the patient's chart and labs.  Questions were answered to the patient's satisfaction.     Theron Aristaeter [facility] Medical ClinicJordanMD,FACC 01/02/2016 1:03 PM Cath Lab Visit (complete for each Cath Lab visit)  Clinical Evaluation Leading to the Procedure:   ACS: Yes.    Non-ACS:    Anginal Classification: CCS III  Anti-ischemic medical therapy: Maximal Therapy (2 or more classes of medications)  Non-Invasive Test Results: No non-invasive testing performed  Prior CABG: No previous CABG

## 2016-01-02 NOTE — Care Management Note (Signed)
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  Patient has transportation at discharge.  Patient has been on Brilinta since November , he has all his meds at home and does not need any refills.  NCM will cont to follow for dc needs.                Action/Plan:   Expected Discharge Date:                  Expected Discharge Plan:  Home/Self Care  In-House Referral:     Discharge planning Services  CM Consult, Medication Assistance  Post Acute Care Choice:    Choice offered to:     DME Arranged:    DME Agency:     HH Arranged:    HH Agency:     Status of Service:  In process, will continue to follow  Medicare Important Message Given:    Date Medicare IM Given:    Medicare IM give by:    Date Additional Medicare IM Given:    Additional Medicare Important Message give by:     If discussed at Long Length of Stay Meetings, dates discussed:    Additional Comments:  Leone Havenaylor, Sabrinna Yearwood [place], RN 01/02/2016, 5:16 PM

## 2016-01-02 NOTE — Progress Notes (Signed)
ANTICOAGULATION CONSULT NOTE - Follow Up Consult  Pharmacy Consult for heparin Indication: chest pain/ACS  No Known Allergies  Patient Measurements: Height: 5\' 2"  (157.5 cm) Weight: 121 lb 3.2 oz (54.976 kg) IBW/kg (Calculated) : 54.6 Heparin Dosing Weight: 54.9 kg  Vital Signs: Temp: 98.2 F (36.8 C) (05/30 0546) Temp Source: Oral (05/30 0546) BP: 131/89 mmHg (05/30 0546) Pulse Rate: 83 (05/30 0546)  Labs:  Recent Labs  01/01/16 0738 01/01/16 1108 01/01/16 1630 01/01/16 2103 01/01/16 2230 01/02/16 0527  HGB 16.8  --   --   --   --  17.4*  HCT 49.4  --   --   --   --  51.4  PLT 146*  --   --   --   --  134*  LABPROT  --  13.5  --   --   --   --   INR  --  1.01  --   --   --   --   HEPARINUNFRC  --   --   --  0.73*  --  0.49  CREATININE 1.04  --   --   --   --  0.98  TROPONINI  --  0.04* 0.04*  --  0.04*  --     Estimated Creatinine Clearance: 61.1 mL/min (by C-G formula based on Cr of 0.98).   Medical History: Past Medical History  Diagnosis Date  . ST elevation myocardial infarction involving right coronary artery (HCC) 07/05/2015  . CAD (coronary artery disease) 07/06/2015    a. 99% dRCA with 100% rPAV, long-tubular ~70% pLAD & mid D1 - PCI of dRCA-rPDA but unable to revascularize rPAV-RPL. LAD treated medically for now, considering further OP workup.  . Cardiomyopathy (HCC)     a. Echo 07/07/15: EF 30%, Inf Akinesis wtih Global HK, normal valves  . Chronic systolic CHF (congestive heart failure) (HCC)   . Hyperlipidemia   . Abnormal LFTs   . Tobacco abuse   . Polycythemia     Medications:  Scheduled:  . [START ON 01/03/2016] aspirin EC  81 mg Oral Daily  . atorvastatin  80 mg Oral QPM  . carvedilol  3.125 mg Oral BID WC  . pantoprazole  40 mg Oral Daily  . sodium chloride flush  3 mL Intravenous Q12H  . ticagrelor  90 mg Oral BID    Assessment: 60 yo male presents 5/29 with SOB and chest pain. [facility] admission [facility]26-5/28 for same issue.  Trop 0.04. Planning for cardiac catheterization 5/30. Heparin therapeutic on 550 units/hr.    Goal of Therapy:  Heparin level 0.3-0.7 units/ml Monitor platelets by anticoagulation protocol: Yes   Plan:  Continue Heparin drip 550 uts/hr  Check daily heparin level and CBC Add-on for cath schedule today.  Toys 'R' UsKimberly Gilmar Bua, Pharm.D., BCPS Clinical Pharmacist Pager [phone] 01/02/2016 11:26 AM

## 2016-01-02 NOTE — H&P (View-Only) (Signed)
    Subjective:  Denies CP; dyspnea improving   Objective:  Filed Vitals:   01/01/16 1353 01/01/16 1953 01/02/16 0025 01/02/16 0546  BP: 113/91 122/70 106/53 131/89  Pulse: 94 79 74 83  Temp: 98.4 F (36.9 C) 98.6 F (37 C) 99.8 F (37.7 C) 98.2 F (36.8 C)  TempSrc: Oral Oral Oral Oral  Resp: 22     Height: 5' 2" (1.575 m)     Weight: 121 lb (54.885 kg)   121 lb 3.2 oz (54.976 kg)  SpO2: 97% 97% 94% 95%    Intake/Output from previous day:  Intake/Output Summary (Last 24 hours) at 01/02/16 0850 Last data filed at 01/01/16 2000  Gross per 24 hour  Intake  385.9 ml  Output      0 ml  Net  385.9 ml    Physical Exam: Physical exam: Well-developed well-nourished in no acute distress.  Skin is warm and dry.  HEENT is normal.  Neck is supple.  Chest is clear to auscultation with normal expansion.  Cardiovascular exam is regular rate and rhythm.  Abdominal exam nontender or distended. No masses palpated. Extremities show no edema. neuro grossly intact    Lab Results: Basic Metabolic Panel:  Recent Labs  01/01/16 0738 01/01/16 1630 01/02/16 0527  NA 136  --  137  K 3.6  --  3.4*  CL 104  --  104  CO2 21*  --  24  GLUCOSE 121*  --  104*  BUN 9  --  8  CREATININE 1.04  --  0.98  CALCIUM 8.2*  --  8.6*  MG  --  1.7  --    CBC:  Recent Labs  01/01/16 0738 01/02/16 0527  WBC 9.8 8.1  HGB 16.8 17.4*  HCT 49.4 51.4  MCV 89.3 89.5  PLT 146* 134*   Cardiac Enzymes:  Recent Labs  01/01/16 1108 01/01/16 1630 01/01/16 2230  TROPONINI 0.04* 0.04* 0.04*     Assessment/Plan:  1 unstable angina-patient is now pain-free. Enzymes are not consistent with an acute coronary syndrome. Continue aspirin, heparin, beta blockade and statin. Proceed with cardiac catheterization. The risks and benefits were discussed and he agrees to proceed. 2 ischemic cardiomyopathy-continue beta blocker. Will add ACE inhibitor prior to discharge if blood pressure allows. 3  hyperlipidemia-continue statin. 4 hypokalemia-supplement. 5 CAD-s/p recent PCI of RCA and residual LAD disease-continue ASA, statin and brilinta.  Gayle Martinez 01/02/2016, 8:50 AM    

## 2016-01-03 ENCOUNTER — Telehealth: Payer: Self-pay | Admitting: Cardiology

## 2016-01-03 ENCOUNTER — Encounter (HOSPITAL_COMMUNITY): Payer: Self-pay | Admitting: Cardiology

## 2016-01-03 DIAGNOSIS — I2510 Atherosclerotic heart disease of native coronary artery without angina pectoris: Secondary | ICD-10-CM

## 2016-01-03 LAB — CBC
HCT: 44.2 % (ref 39.0–52.0)
Hemoglobin: 14.5 g/dL (ref 13.0–17.0)
MCH: 29.1 pg (ref 26.0–34.0)
MCHC: 32.8 g/dL (ref 30.0–36.0)
MCV: 88.8 fL (ref 78.0–100.0)
PLATELETS: 132 10*3/uL — AB (ref 150–400)
RBC: 4.98 MIL/uL (ref 4.22–5.81)
RDW: 14.2 % (ref 11.5–15.5)
WBC: 11 10*3/uL — AB (ref 4.0–10.5)

## 2016-01-03 LAB — BASIC METABOLIC PANEL
Anion gap: 8 (ref 5–15)
BUN: 5 mg/dL — AB (ref 6–20)
CHLORIDE: 105 mmol/L (ref 101–111)
CO2: 24 mmol/L (ref 22–32)
CREATININE: 1.05 mg/dL (ref 0.61–1.24)
Calcium: 8.4 mg/dL — ABNORMAL LOW (ref 8.9–10.3)
Glucose, Bld: 92 mg/dL (ref 65–99)
Potassium: 3.3 mmol/L — ABNORMAL LOW (ref 3.5–5.1)
SODIUM: 137 mmol/L (ref 135–145)

## 2016-01-03 MED ORDER — POTASSIUM CHLORIDE CRYS ER 20 MEQ PO TBCR
40.0000 meq | EXTENDED_RELEASE_TABLET | Freq: Once | ORAL | Status: AC
  Administered 2016-01-03: 10:00:00 40 meq via ORAL
  Filled 2016-01-03: qty 2

## 2016-01-03 MED ORDER — LISINOPRIL 5 MG PO TABS
2.5000 mg | ORAL_TABLET | Freq: Every day | ORAL | Status: DC
  Administered 2016-01-03: 10:00:00 2.5 mg via ORAL
  Filled 2016-01-03: qty 1

## 2016-01-03 MED ORDER — LISINOPRIL 2.5 MG PO TABS: 2.5000 mg | ORAL_TABLET | Freq: Every day | ORAL | Status: AC

## 2016-01-03 NOTE — Discharge Summary (Signed)
Discharge Summary    Patient ID: Jason Barry,  MRN: [id number], DOB/AGE: 62/01/1956 62 y.o.  Admit date: 01/01/2016 Discharge date: 01/03/2016  Primary Care Provider: No PCP Per Patient Primary Cardiologist: Dr. Tresa EndoKelly  Discharge Diagnoses    Principal Problem:   CAD (coronary artery disease), native coronary artery Active Problems:   Presence of drug coated stent in right coronary artery   Cardiomyopathy, ischemic; EF 30% (out of proportion to CAD)   Hyperlipidemia with target LDL less than 70; Poorly controlled   Chest pain   Unstable angina (HCC)   Allergies No Known Allergies  Diagnostic Studies/Procedures    Procedures    Coronary Stent Intervention   Intravascular Pressure Wire/FFR Study   Left Heart Cath and Coronary Angiography    Conclusion     Post Atrio lesion, 100% stenosed.  Prox RCA to Mid RCA lesion, 35% stenosed.  Ost Cx to Mid Cx lesion, 40% stenosed.  Ost LAD to Prox LAD lesion, 70% stenosed.  There is severe left ventricular systolic dysfunction.  Dist LAD lesion, 95% stenosed. Post intervention, there is a 0% residual stenosis.  1. 2 vessel obstructive CAD  - severe mid LAD stenosis that is new from November 2016.  - occluded small PL branch of the RCA 2. Moderate proximal LAD with FFR 0.82- will treat this medically 3. Patent stent in the distal RCA/PDA 4. Severe LV dysfunction 5. Successful stenting of the mid LAD with DES      History of Present Illness     60 y/o male, followed by Dr. Tresa EndoKelly, with a h/o CAD s/p cath in 07/2015 with DES to dRCA-rPDA, also with a small occluded rPAV-RPL lesion not amendable to PCI and a 70% LAD, treated medically, along with ischemic cardiomyopathy/ chronic systolic HF with EF of 30% and HLD, admitted for unstable angina. Patient complained of worsening dyspnea and exertional CP over the last several months.   Hospital Course     Patient was admitted to telemetry. He was felt to be  euvolemic at time of admission and did not require IV lasix. D-dimer was negative. Cardiac enzymes were cycled x 3 and were minimally elevated with flat, low level, trend at 0.04>>0.04>>0.04. Given his worsening pain since his cath in 2016, repeat LHC was recommended. This was performed by Dr. SwazilandJordan on 01/02/16. He was found to have 2 vessel obstructive CAD with severe mid LAD stenosis and a known occluded, small, PL branch of the RCA (previously deemed not amendable to PCI). The LAD lesion was measured with FFR testing and was not significant, measuring at 0.82. The previously placed distal RCA/PDA stent was patent. No PCI was indicated. Continued medical therapy was recommended. EF was reassessed by cath and remained low at 25-35%. Dr. SwazilandJordan recommended repeat 2D echo in 3 months to reassess EF and consideration for ICD if not improved to >35%. The patient left the cath lab in stable condition. He was continued on DAPT with ASA + Brilinta, along with a BB (Coreg) and high dose statin (Lipitor, 80 mg). Low dose ACE-I, 2.5 mg of lisinopril was added. He was monitored overnight and had no post cath complications. His BP was soft but stable in the mid 90s systolic. He had no difficulties ambulating with cardiac rehab. He was last seen and examined by Dr. Jens Somrenshaw, who determined he was stable for discharge home. [facility]u has been arranged with Cline CrockKathryn Thompson, PA-C, on 01/10/16.   He will need a repeat  2D echo in 3 months to reassess LV systolic EF. If EF remains < 35%, he will need referral to EP clinic for evaluation for an ICD for primary prevention. He will also need a repeat BMP in 1 week, to reassess renal function and K after starting lisinopril.    Consultants: none   Discharge Vitals Blood pressure 93/56, pulse 85, temperature 98.1 F (36.7 C), temperature source Oral, resp. rate 25, height 5\' 2"  (1.575 m), weight 121 lb 11.1 oz (55.2 kg), SpO2 94 %.  Filed Weights   01/01/16 1353  01/02/16 0546 01/03/16 0532  Weight: 121 lb (54.885 kg) 121 lb 3.2 oz (54.976 kg) 121 lb 11.1 oz (55.2 kg)    Labs & Radiologic Studies    CBC  Recent Labs  01/02/16 0527 01/03/16 0403  WBC 8.1 11.0*  HGB 17.4* 14.5  HCT 51.4 44.2  MCV 89.5 88.8  PLT 134* 132*   Basic Metabolic Panel  Recent Labs  01/01/16 1630 01/02/16 0527 01/03/16 0403  NA  --  137 137  K  --  3.4* 3.3*  CL  --  104 105  CO2  --  24 24  GLUCOSE  --  104* 92  BUN  --  8 5*  CREATININE  --  0.98 1.05  CALCIUM  --  8.6* 8.4*  MG 1.7  --   --    Liver Function Tests  Recent Labs  01/02/16 0527  AST 41  ALT 25  ALKPHOS 62  BILITOT 1.5*  PROT 5.9*  ALBUMIN 3.2*   No results for input(s): LIPASE, AMYLASE in the last 72 hours. Cardiac Enzymes  Recent Labs  01/01/16 1108 01/01/16 1630 01/01/16 2230  TROPONINI 0.04* 0.04* 0.04*   BNP Invalid input(s): POCBNP D-Dimer  Recent Labs  01/01/16 1108  DDIMER <0.27   Hemoglobin A1C  Recent Labs  01/01/16 1528  HGBA1C 6.7*   Fasting Lipid Panel  Recent Labs  01/02/16 0527  CHOL 124  HDL 30*  LDLCALC 72  TRIG 782  CHOLHDL 4.1   Thyroid Function Tests  Recent Labs  01/01/16 1630  TSH 1.817   _____________  Dg Chest 2 View  01/01/2016  CLINICAL DATA:  Chest pain EXAM: CHEST  2 VIEW COMPARISON:  07/05/2015 FINDINGS: The heart size and mediastinal contours are within normal limits. Both lungs are clear. The visualized skeletal structures are unremarkable. IMPRESSION: No active cardiopulmonary disease. Electronically Signed   By: Alcide Clever M.D.   On: 01/01/2016 07:13   Disposition   Pt is being discharged home today in good condition.  Follow-up Plans & Appointments    Follow-up Information    Follow up with Cline Crock, PA-C On 01/10/2016.   Specialties:  Cardiology, Radiology   Why:   9:00 AM (Dr. Landry Dyke PA @ [address] office)    Contact information:   [address] [place]  [address] [phone]       Follow up with Nicki Guadalajara, MD On 04/17/2016.   Specialty:  Cardiology   Why:  10:15 AM   Contact information:   [address] [place] [address] [phone]      Discharge Instructions    AMB Referral to Cardiac Rehabilitation - Phase II    Complete by:  As directed   Diagnosis:  Coronary Stents     Amb Referral to Cardiac Rehabilitation    Complete by:  As directed   Diagnosis:  Coronary Stents  Diet - low sodium heart healthy    Complete by:  As directed      Increase activity slowly    Complete by:  As directed            Discharge Medications   Current Discharge Medication List    START taking these medications   Details  lisinopril (PRINIVIL,ZESTRIL) 2.5 MG tablet Take 1 tablet (2.5 mg total) by mouth daily. Qty: 30 tablet, Refills: 5      CONTINUE these medications which have NOT CHANGED   Details  aspirin EC 81 MG EC tablet Take 1 tablet (81 mg total) by mouth daily. Qty: 30 tablet, Refills: 11    atorvastatin (LIPITOR) 80 MG tablet Take 1 tablet (80 mg total) by mouth every evening. Qty: 30 tablet, Refills: 6    carvedilol (COREG) 3.125 MG tablet Take 1 tablet (3.125 mg total) by mouth 2 (two) times daily with a meal. Qty: 60 tablet, Refills: 6    furosemide (LASIX) 20 MG tablet Take 20 mg by mouth 2 (two) times daily.    nitroGLYCERIN (NITROSTAT) 0.4 MG SL tablet Place 1 tablet (0.4 mg total) under the tongue every 5 (five) minutes as needed for chest pain (up to 3 doses). Qty: 25 tablet, Refills: 3    omeprazole (PRILOSEC) 20 MG capsule Take 40 mg by mouth daily.    ticagrelor (BRILINTA) 90 MG TABS tablet Take 1 tablet (90 mg total) by mouth 2 (two) times daily. Qty: 60 tablet, Refills: 11         Aspirin prescribed at discharge?  Yes High Intensity Statin Prescribed? (Lipitor 40-80mg  or Crestor 20-40mg ): Yes Beta Blocker Prescribed? Yes For EF <40%, was ACEI/ARB Prescribed? Yes: soft  BP ADP Receptor Inhibitor Prescribed? (i.e. Plavix etc.-Includes Medically Managed Patients): Yes For EF <40%, Aldosterone Inhibitor Prescribed? No: soft BP Was EF assessed during THIS hospitalization? Yes Was Cardiac Rehab II ordered? (Included Medically managed Patients): No:    Outstanding Labs/Studies   Will need repeat 2D echo in 3 months.   Duration of Discharge Encounter   Greater than 30 minutes including physician time.  Signed, Robbie Lis PA-C 01/03/2016, 9:35 AM

## 2016-01-03 NOTE — Progress Notes (Signed)
CARDIAC REHAB PHASE I   PRE:  Rate/Rhythm: 90 SR c/ PVCs  BP:  Lying: 93/56        SaO2: 95 RA  MODE:  Ambulation: 500 ft   POST:  Rate/Rhythm: 103 ST  BP:  Sitting: 136/87         SaO2: 96 RA  Pt ambulated 500 ft on RA, independent, steady gait, tolerated well. Pt c/o mild DOE, denies cp, dizziness, declined rest stop. Completed PCI/stent education.  Reviewed risk factors, anti-platelet therapy, stent book, stent card, activity restrictions, ntg, exercise, heart healthy diet, sodium restrictions, CHF booklet and zone tool, daily weights and phase 2 cardiac rehab. Pt verbalized understanding, able to perform teach back. Pt agrees to phase 2 cardiac rehab referral, will send to [facility] per pt request. Pt to bed per pt request after walk, call bell within reach.    [phone] Joylene GrapesEmily C Horst Ostermiller, RN, BSN 01/03/2016 9:32 AM

## 2016-01-03 NOTE — Progress Notes (Signed)
    Subjective:  Denies CP or dyspnea   Objective:  Filed Vitals:   01/02/16 1450 01/02/16 1511 01/02/16 2023 01/03/16 0532  BP: 104/80 120/76 129/77 95/55  Pulse: 86 85 87 85  Temp: 97.6 F (36.4 C) 98.3 F (36.8 C) 98.4 F (36.9 C) 98.1 F (36.7 C)  TempSrc: Oral Oral Oral Oral  Resp: 24 22 26 18   Height:      Weight:    121 lb 11.1 oz (55.2 kg)  SpO2: 92% 97% 96% 94%    Intake/Output from previous day:  Intake/Output Summary (Last 24 hours) at 01/03/16 0840 Last data filed at 01/03/16 0132  Gross per 24 hour  Intake    480 ml  Output      0 ml  Net    480 ml    Physical Exam: Physical exam: Well-developed well-nourished in no acute distress.  Skin is warm and dry.  HEENT is normal.  Neck is supple. No thyromegaly.  Chest is clear to auscultation with normal expansion.  Cardiovascular exam is regular rate and rhythm.  Abdominal exam nontender or distended. No masses palpated. Extremities show no edema. neuro grossly intact    Lab Results: Basic Metabolic Panel:  Recent Labs  09/81/1905/29/17 1630 01/02/16 0527 01/03/16 0403  NA  --  137 137  K  --  3.4* 3.3*  CL  --  104 105  CO2  --  24 24  GLUCOSE  --  104* 92  BUN  --  8 5*  CREATININE  --  0.98 1.05  CALCIUM  --  8.6* 8.4*  MG 1.7  --   --    CBC:  Recent Labs  01/02/16 0527 01/03/16 0403  WBC 8.1 11.0*  HGB 17.4* 14.5  HCT 51.4 44.2  MCV 89.5 88.8  PLT 134* 132*   Cardiac Enzymes:  Recent Labs  01/01/16 1108 01/01/16 1630 01/01/16 2230  TROPONINI 0.04* 0.04* 0.04*     Assessment/Plan:  1 unstable angina-patient is s/p PCI of LAD. Continue aspirin, brilinta, beta blockade and statin. No recurrent CP. 2 ischemic cardiomyopathy-continue beta blocker. Add low dose lisinopril 2.5 mg daily; repeat echo 3 months; if EF <35 ICD.  3 hyperlipidemia-continue statin. 4 hypokalemia-supplement. 5 CAD-s/p recent PCI of RCA, LAD and residual LAD disease-continue ASA, statin and  brilinta. Plan DC today and fu with PA one week (TOC); check bmet at that time; FU with Dr Tresa EndoKelly 8 weeks. > 30 min PA and physician time D2 Olga MillersBrian Crenshaw 01/03/2016, 8:40 AM

## 2016-01-05 ENCOUNTER — Encounter (HOSPITAL_COMMUNITY): Payer: Self-pay | Admitting: Emergency Medicine

## 2016-01-05 ENCOUNTER — Emergency Department (HOSPITAL_COMMUNITY)
Admission: EM | Admit: 2016-01-05 | Discharge: 2016-01-05 | Disposition: A | Discharge status: Home / Self Care | Payer: MEDICAID | Attending: Emergency Medicine | Admitting: Emergency Medicine

## 2016-01-05 DIAGNOSIS — Z862 Personal history of diseases of the blood and blood-forming organs and certain disorders involving the immune mechanism: Secondary | ICD-10-CM | POA: Insufficient documentation

## 2016-01-05 DIAGNOSIS — Z87891 Personal history of nicotine dependence: Secondary | ICD-10-CM | POA: Insufficient documentation

## 2016-01-05 DIAGNOSIS — R040 Epistaxis: Secondary | ICD-10-CM

## 2016-01-05 DIAGNOSIS — Z9889 Other specified postprocedural states: Secondary | ICD-10-CM | POA: Insufficient documentation

## 2016-01-05 DIAGNOSIS — Z79899 Other long term (current) drug therapy: Secondary | ICD-10-CM | POA: Insufficient documentation

## 2016-01-05 DIAGNOSIS — T45525A Adverse effect of antithrombotic drugs, initial encounter: Secondary | ICD-10-CM | POA: Insufficient documentation

## 2016-01-05 DIAGNOSIS — Z7982 Long term (current) use of aspirin: Secondary | ICD-10-CM | POA: Insufficient documentation

## 2016-01-05 DIAGNOSIS — I2510 Atherosclerotic heart disease of native coronary artery without angina pectoris: Secondary | ICD-10-CM | POA: Insufficient documentation

## 2016-01-05 DIAGNOSIS — E785 Hyperlipidemia, unspecified: Secondary | ICD-10-CM | POA: Insufficient documentation

## 2016-01-05 DIAGNOSIS — T457X5A Adverse effect of anticoagulant antagonists, vitamin K and other coagulants, initial encounter: Secondary | ICD-10-CM

## 2016-01-05 DIAGNOSIS — I252 Old myocardial infarction: Secondary | ICD-10-CM | POA: Insufficient documentation

## 2016-01-05 DIAGNOSIS — I5022 Chronic systolic (congestive) heart failure: Secondary | ICD-10-CM | POA: Insufficient documentation

## 2016-01-05 LAB — I-STAT CHEM 8, ED
BUN: 9 mg/dL (ref 6–20)
CHLORIDE: 98 mmol/L — AB (ref 101–111)
Calcium, Ion: 1.04 mmol/L — ABNORMAL LOW (ref 1.13–1.30)
Creatinine, Ser: 1.1 mg/dL (ref 0.61–1.24)
GLUCOSE: 171 mg/dL — AB (ref 65–99)
HCT: 50 % (ref 39.0–52.0)
Hemoglobin: 17 g/dL (ref 13.0–17.0)
POTASSIUM: 3.2 mmol/L — AB (ref 3.5–5.1)
Sodium: 138 mmol/L (ref 135–145)
TCO2: 26 mmol/L (ref 0–100)

## 2016-01-05 MED ORDER — OXYMETAZOLINE HCL 0.05 % NA SOLN: 1.0000 | Freq: Two times a day (BID) | NASAL | Status: DC | PRN

## 2016-01-05 NOTE — Discharge Instructions (Signed)

## 2016-01-05 NOTE — ED Notes (Signed)
PTS NOSE BLEED IS SLOWING DOWN NOW  GIVEN  GAUZE

## 2016-01-05 NOTE — ED Notes (Signed)
PTS NOSE BLEED HAS STOPPED PT WANTING TO LEAVE  TOLD PT THAT HIS POTASSIUM IS LOW  RECOMMEND THAT THE PT STAY

## 2016-01-05 NOTE — ED Notes (Signed)
Pt verbalized understanding of d/c instructions and has no further questions. Pt stable and NAD.  

## 2016-01-05 NOTE — ED Provider Notes (Signed)
CSN: [id number]     Arrival date & time 01/05/16  1915 History   First MD Initiated Contact with Patient 01/05/16 2142     Chief Complaint  Patient presents with  . Epistaxis     (Consider location/radiation/quality/duration/timing/severity/associated sxs/prior Treatment) Patient is a 60 y.o. male presenting with nosebleeds. The history is provided by the patient.  Epistaxis Location:  Bilateral Severity:  Mild Duration:  30 minutes Timing:  Intermittent Progression:  Resolved Chronicity:  New Context: aspirin use   Context comment:  Ticagrelor Relieved by:  Applying pressure Worsened by:  Nothing tried Associated symptoms: no cough, no fever and no sore throat     Past Medical History  Diagnosis Date  . ST elevation myocardial infarction involving right coronary artery (HCC) 07/05/2015  . CAD (coronary artery disease) 07/06/2015    a. 99% dRCA with 100% rPAV, long-tubular ~70% pLAD & mid D1 - PCI of dRCA-rPDA but unable to revascularize rPAV-RPL. LAD treated medically for now, considering further OP workup.  . Cardiomyopathy (HCC)     a. Echo 07/07/15: EF 30%, Inf Akinesis wtih Global HK, normal valves  . Chronic systolic CHF (congestive heart failure) (HCC)   . Hyperlipidemia   . Abnormal LFTs   . Tobacco abuse   . Polycythemia    Past Surgical History  Procedure Laterality Date  . Appendectomy    . Cardiac catheterization N/A 07/05/2015    Procedure: Left Heart Cath and Coronary Angiography;  Surgeon: Lennette Biharihomas A Kelly, MD;  [facility];  Service: Cardiovascular;  dRCA-rPDA 99%, 100% rPAV-PL (PCI of dRCA-rPDA), Ost-Prox LAD 70% (also mD1 ~70-80), EF 20%  . Cardiac catheterization N/A 07/05/2015    Procedure: Coronary Stent Intervention;  Surgeon: Lennette Biharihomas A Kelly, MD;  [facility];  Service: Cardiovascular;  Xience Alpine DES 3 x 38 (3.25-3.14 taper) dRCA-rPDA; jailed 100% occluded rPAV.  . Transthoracic echocardiogram  07/07/2015    Normal LV  size with EF 30%. Diffuse hypokinesis with inferior akinesis. Normal RV size and systolic function. No significant valvular abnormalities.  . Cardiac catheterization N/A 01/02/2016    Procedure: Left Heart Cath and Coronary Angiography;  Surgeon: Peter M SwazilandJordan, MD;  [facility];  Service: Cardiovascular;  Laterality: N/A;  . Cardiac catheterization N/A 01/02/2016    Procedure: Intravascular Pressure Wire/FFR Study;  Surgeon: Peter M SwazilandJordan, MD;  [facility];  Service: Cardiovascular;  Laterality: N/A;  . Cardiac catheterization N/A 01/02/2016    Procedure: Coronary Stent Intervention;  Surgeon: Peter M SwazilandJordan, MD;  [facility];  Service: Cardiovascular;  Laterality: N/A;   Family History  Problem Relation Age of Onset  . Heart attack Neg Hx   . Hypertension Neg Hx   . Stroke Neg Hx    Social History  Substance Use Topics  . Smoking status: Former Smoker    Quit date: 09/06/2015  . Smokeless tobacco: None  . Alcohol Use: No    Review of Systems  Constitutional: Negative for fever.  HENT: Positive for nosebleeds. Negative for sore throat.   Respiratory: Negative for cough.   All other systems reviewed and are negative.     Allergies  Review of patient's allergies indicates no known allergies.  Home Medications   Prior to Admission medications   Medication Sig Start Date End Date Taking? Authorizing Provider  aspirin EC 81 MG EC tablet Take 1 tablet (81 mg total) by mouth daily. 07/08/15   Dayna  N Dunn, PA-C  atorvastatin (LIPITOR) 80 MG tablet Take 1 tablet (80 mg total) by mouth every evening. 07/19/15   Brittainy Sherlynn Carbon, PA-C  carvedilol (COREG) 3.125 MG tablet Take 1 tablet (3.125 mg total) by mouth 2 (two) times daily with a meal. 07/19/15   Brittainy Sherlynn Carbon, PA-C  furosemide (LASIX) 20 MG tablet Take 20 mg by mouth 2 (two) times daily.    Historical Provider, MD  lisinopril (PRINIVIL,ZESTRIL) 2.5 MG tablet Take 1  tablet (2.5 mg total) by mouth daily. 01/03/16   Brittainy Sherlynn Carbon, PA-C  nitroGLYCERIN (NITROSTAT) 0.4 MG SL tablet Place 1 tablet (0.4 mg total) under the tongue every 5 (five) minutes as needed for chest pain (up to 3 doses). 07/19/15   Brittainy Sherlynn Carbon, PA-C  omeprazole (PRILOSEC) 20 MG capsule Take 40 mg by mouth daily.    Historical Provider, MD  oxymetazoline (AFRIN NASAL SPRAY) 0.05 % nasal spray Place 1 spray into both nostrils 2 (two) times daily as needed (nose bleeding for maximum of 3 days). 01/05/16   Lyndal Pulley, MD  ticagrelor (BRILINTA) 90 MG TABS tablet Take 1 tablet (90 mg total) by mouth 2 (two) times daily. 07/19/15   Brittainy M Simmons, PA-C   BP 122/81 mmHg  Pulse 75  Temp(Src) 97.5 F (36.4 C) (Oral)  Resp 18  SpO2 99% Physical Exam  Constitutional: He is oriented to person, place, and time. He appears well-developed and well-nourished. No distress.  HENT:  Head: Normocephalic and atraumatic.  Nose: No mucosal edema or nasal septal hematoma. No epistaxis (dried blood).  Eyes: Conjunctivae are normal.  Neck: Neck supple. No tracheal deviation present.  Cardiovascular: Normal rate and regular rhythm.   Pulmonary/Chest: Effort normal. No respiratory distress.  Abdominal: Soft. He exhibits no distension.  Neurological: He is alert and oriented to person, place, and time.  Skin: Skin is warm and dry.  Psychiatric: He has a normal mood and affect.    ED Course  Procedures (including critical care time) Labs Review Labs Reviewed  I-STAT CHEM 8, ED - Abnormal; Notable for the following:    Potassium 3.2 (*)    Chloride 98 (*)    Glucose, Bld 171 (*)    Calcium, Ion 1.04 (*)    All other components within normal limits    Imaging Review No results found. I have personally reviewed and evaluated these images and lab results as part of my medical decision-making.   EKG Interpretation None      MDM   Final diagnoses:  Epistaxis  Adverse reaction to  antiplatelet agent, initial encounter    60 y.o. male presents with epistaxis that is resolved on arrival. On antiplatelet therpay for recent cardiac admission and has had nosebleed requiring cauterization previously. Hb stable, No acute distress on exam. Provided afrin for help with abortive therapy.Plan to follow up with PCP as needed and return precautions discussed for worsening or new concerning symptoms.     Lyndal Pulley, MD 01/06/16 [phone]

## 2016-01-05 NOTE — ED Notes (Signed)
C/o nosebleed (both nares) x 2 hours.  Bleeding is minimal at this time.  Pt states he is on blood thinner but doesn't know the name of it.  States it is in chart- but it is not listed in meds.

## 2016-01-07 NOTE — Progress Notes (Signed)
  ROS  This encounter was created in error - please disregard. 

## 2016-01-10 ENCOUNTER — Encounter: Payer: Self-pay | Admitting: Physician Assistant

## 2016-01-11 NOTE — Telephone Encounter (Signed)
Encounter opened in error.  Jason Barry

## 2016-01-17 ENCOUNTER — Ambulatory Visit: Payer: Self-pay | Admitting: Physician Assistant

## 2016-01-29 ENCOUNTER — Encounter: Payer: Self-pay | Admitting: Physician Assistant

## 2016-01-29 NOTE — Progress Notes (Signed)
Cardiology Office Note:    Date:  01/29/2016   ID:  Jason GranaBilly Barry, DOB 05/16/1956, MRN [id number]  PCP:  No PCP Per Patient  Cardiologist:  Dr. Nicki Guadalajarahomas Kelly   Electrophysiologist:  n/a  Referring MD: No ref. provider found   Chief Complaint  Patient presents with  . Hospitalization Follow-up    admx in 5/17 with [place] >> PCI    History of Present Illness:     Jason Barry is a 60 y.o. male with a hx of CAD status post inferior STEMI in 12/16 treated with DES to the distal RCA/RPDA and 12/16, known occluded small R PAVB, LAD 70%, ischemia CM, systolic HF, HL.  EF has been as low as 30%.  He was admitted 5/29-5/31 with unstable angina. Cardiac enzymes remained minimally elevated without clear trend. LHC demonstrated 2 vessel CAD with severe mid LAD stenosis and a known occluded small posterior lateral branch of the RCA nonamenable to PCI. Mid LAD stenosis was treated with DES. Proximal LAD lesion was assessed with FFR and not felt to be hemodynamically significant. RCA stent was patent.  Patient will need follow-up echo 3 months post PCI. If EF less than 35%, he will need referral to EP for ICD.    Past Medical History  Diagnosis Date  . ST elevation myocardial infarction involving right coronary artery (HCC) 07/05/2015  . CAD (coronary artery disease) 07/06/2015    a. inf STEMI 12/16: 99% dRCA with 100% rPAV, long-tubular ~70% pLAD & mid D1 - PCI of dRCA-rPDA but unable to revascularize rPAV-RPL. LAD treated medically for now, considering further OP workup.// b. LHC 5/17: oLAD 70 (FFR 0.82 >> med Rx), dLAD 95, oLCx 40, pRCA 35, dRCA stent patent, RPAVB 100, EF 25-35% >> PCI: DES to dLAD  . Ischemic cardiomyopathy     a. Echo 07/07/15: EF 30%, Inf Akinesis wtih Global HK, normal valves  . Chronic systolic CHF (congestive heart failure) (HCC)   . Hyperlipidemia   . Abnormal LFTs   . Tobacco abuse   . Polycythemia     Past Surgical History  Procedure Laterality Date  . Appendectomy     . Cardiac catheterization N/A 07/05/2015    Procedure: Left Heart Cath and Coronary Angiography;  Surgeon: Lennette Biharihomas A Kelly, MD;  [facility];  Service: Cardiovascular;  dRCA-rPDA 99%, 100% rPAV-PL (PCI of dRCA-rPDA), Ost-Prox LAD 70% (also mD1 ~70-80), EF 20%  . Cardiac catheterization N/A 07/05/2015    Procedure: Coronary Stent Intervention;  Surgeon: Lennette Biharihomas A Kelly, MD;  [facility];  Service: Cardiovascular;  Xience Alpine DES 3 x 38 (3.25-3.14 taper) dRCA-rPDA; jailed 100% occluded rPAV.  . Transthoracic echocardiogram  07/07/2015    Normal LV size with EF 30%. Diffuse hypokinesis with inferior akinesis. Normal RV size and systolic function. No significant valvular abnormalities.  . Cardiac catheterization N/A 01/02/2016    Procedure: Left Heart Cath and Coronary Angiography;  Surgeon: Peter M SwazilandJordan, MD;  [facility];  Service: Cardiovascular;  Laterality: N/A;  . Cardiac catheterization N/A 01/02/2016    Procedure: Intravascular Pressure Wire/FFR Study;  Surgeon: Peter M SwazilandJordan, MD;  [facility];  Service: Cardiovascular;  Laterality: N/A;  . Cardiac catheterization N/A 01/02/2016    Procedure: Coronary Stent Intervention;  Surgeon: Peter M SwazilandJordan, MD;  [facility];  Service: Cardiovascular;  Laterality: N/A;    Current Medications: Outpatient Prescriptions Prior to Visit  Medication Sig Dispense Refill  .  aspirin EC 81 MG EC tablet Take 1 tablet (81 mg total) by mouth daily. 30 tablet 11  . atorvastatin (LIPITOR) 80 MG tablet Take 1 tablet (80 mg total) by mouth every evening. 30 tablet 6  . carvedilol (COREG) 3.125 MG tablet Take 1 tablet (3.125 mg total) by mouth 2 (two) times daily with a meal. 60 tablet 6  . furosemide (LASIX) 20 MG tablet Take 20 mg by mouth 2 (two) times daily.    Marland Kitchen. lisinopril (PRINIVIL,ZESTRIL) 2.5 MG tablet Take 1 tablet (2.5 mg total) by mouth daily. 30 tablet 5  . nitroGLYCERIN  (NITROSTAT) 0.4 MG SL tablet Place 1 tablet (0.4 mg total) under the tongue every 5 (five) minutes as needed for chest pain (up to 3 doses). 25 tablet 3  . omeprazole (PRILOSEC) 20 MG capsule Take 40 mg by mouth daily.    Marland Kitchen. oxymetazoline (AFRIN NASAL SPRAY) 0.05 % nasal spray Place 1 spray into both nostrils 2 (two) times daily as needed (nose bleeding for maximum of 3 days). 30 mL 0  . ticagrelor (BRILINTA) 90 MG TABS tablet Take 1 tablet (90 mg total) by mouth 2 (two) times daily. 60 tablet 11   No facility-administered medications prior to visit.      Allergies:   Review of patient's allergies indicates no known allergies.   Social History   Social History  . Marital Status: Single    Spouse Name: N/A  . Number of Children: N/A  . Years of Education: N/A   Social History Main Topics  . Smoking status: Former Smoker    Quit date: 09/06/2015  . Smokeless tobacco: Not on file  . Alcohol Use: No  . Drug Use: Not on file  . Sexual Activity: Not on file   Other Topics Concern  . Not on file   Social History Narrative   [place]ives in [place] with [place].     Family History:  The patient's family history is negative for Heart attack, Hypertension, and Stroke.   ROS:   Please see the history of present illness.    ROS All other systems reviewed and are negative.   Physical Exam:    VS:  There were no vitals taken for this visit.   Physical Exam  Wt Readings from Last 3 Encounters:  01/03/16 121 lb 11.1 oz (55.2 kg)  08/15/15 121 lb 9.6 oz (55.157 kg)  07/19/15 120 lb (54.432 kg)      Studies/Labs Reviewed:     EKG:  EKG is  ordered today.  The ekg ordered today demonstrates   Recent Labs: 01/01/2016: B Natriuretic Peptide 161.6*; Magnesium 1.7; TSH 1.817 01/02/2016: ALT 25 01/03/2016: Platelets 132* 01/05/2016: BUN 9; Creatinine, Ser 1.10; Hemoglobin 17.0; Potassium 3.2*; Sodium 138   Recent Lipid Panel    Component Value Date/Time   CHOL 124 01/02/2016 0527   TRIG  112 01/02/2016 0527   HDL 30* 01/02/2016 0527   CHOLHDL 4.1 01/02/2016 0527   VLDL 22 01/02/2016 0527   LDLCALC 72 01/02/2016 0527    Additional studies/ records that were reviewed today include:    LHC 01/02/16 LAD ostial 70% >> FFR 0.82; distal 95% LCx ostial 40% RCA proximal 35%, distal stent patent, RPDA stent patent, R posterior AVB 100% EF 25-35% PCI: 2.5 x 24 mm Promus premier DES to the distal LAD; ostial LAD treated medically  Myoview 12/16 Moderate sized area of inferior wall infarct from apex to base with no ischemia EF 35% inferior wall akinesis  Consider MRI to further risk stratify for possible AICD if clinically indicated  Echo 12/16 EF 30%, diffuse HK with inferior HK, grade 1 diastolic dysfunction, trivial MR, mild LAE  LHC 11/16 LAD ostial 70% RCA distal 50%, 99%, RPDA 50%, R posterior AVB 100% PCI: 3 x 38 mm Xience Alpine DES to the distal RCA/RPDA   ASSESSMENT:     1. Coronary artery disease involving native coronary artery of native heart without angina pectoris   2. Chronic systolic CHF (congestive heart failure) (HCC)   3. Cardiomyopathy, ischemic; EF 30% (out of proportion to CAD)   4. Hyperlipidemia with target LDL less than 70; Poorly controlled     PLAN:     In order of problems listed above:  1. CAD - Patient with hx of inferior STEMI with DES to RCA in 12/16 and admit in 5/17 with [place] treated with DES to distal LAD.  Residual CAD treated medically.  He has proximal LAD 70% that was not hemodynamically significant by FFR.  R post AVB is too small for PCI.    2. Chronic systolic HF -   3. Ischemic CM - Patient needs repeat Echo in late August 2017 to reassess LVEF.  If EF < 35%, refer to EP for ICD.    4. HL - LDL in 5/17 was 72 (> 50% reduction c/w LDL in 12/16 of 160).     Medication Adjustments/Labs and Tests Ordered: Current medicines are reviewed at length with the patient today.  Concerns regarding medicines are outlined above.   Medication changes, Labs and Tests ordered today are outlined in the Patient Instructions noted below. There are no Patient Instructions on file for this visit. Signed, Tereso Newcomer, PA-C  01/29/2016 1:43 PM    [facility] [address] [place], [place], [place]  [address] Phone: [phone]; Fax: [phone]     This encounter was created in error - please disregard.

## 2016-03-04 ENCOUNTER — Encounter (HOSPITAL_COMMUNITY): Payer: Self-pay | Admitting: Emergency Medicine

## 2016-03-04 ENCOUNTER — Emergency Department (HOSPITAL_COMMUNITY)
Admission: EM | Admit: 2016-03-04 | Discharge: 2016-03-04 | Disposition: A | Discharge status: Home / Self Care | Payer: Self-pay | Attending: Emergency Medicine | Admitting: Emergency Medicine

## 2016-03-04 ENCOUNTER — Emergency Department (HOSPITAL_COMMUNITY): Payer: Self-pay

## 2016-03-04 DIAGNOSIS — Z7902 Long term (current) use of antithrombotics/antiplatelets: Secondary | ICD-10-CM | POA: Insufficient documentation

## 2016-03-04 DIAGNOSIS — R072 Precordial pain: Secondary | ICD-10-CM | POA: Insufficient documentation

## 2016-03-04 DIAGNOSIS — R079 Chest pain, unspecified: Secondary | ICD-10-CM

## 2016-03-04 DIAGNOSIS — I5022 Chronic systolic (congestive) heart failure: Secondary | ICD-10-CM | POA: Insufficient documentation

## 2016-03-04 DIAGNOSIS — I2510 Atherosclerotic heart disease of native coronary artery without angina pectoris: Secondary | ICD-10-CM | POA: Insufficient documentation

## 2016-03-04 DIAGNOSIS — Z79899 Other long term (current) drug therapy: Secondary | ICD-10-CM | POA: Insufficient documentation

## 2016-03-04 DIAGNOSIS — I252 Old myocardial infarction: Secondary | ICD-10-CM | POA: Insufficient documentation

## 2016-03-04 DIAGNOSIS — Z7982 Long term (current) use of aspirin: Secondary | ICD-10-CM | POA: Insufficient documentation

## 2016-03-04 DIAGNOSIS — Z87891 Personal history of nicotine dependence: Secondary | ICD-10-CM | POA: Insufficient documentation

## 2016-03-04 DIAGNOSIS — Z955 Presence of coronary angioplasty implant and graft: Secondary | ICD-10-CM | POA: Insufficient documentation

## 2016-03-04 LAB — CBC WITH DIFFERENTIAL/PLATELET
Basophils Absolute: 0 10*3/uL (ref 0.0–0.1)
Basophils Relative: 0 %
Eosinophils Absolute: 0.3 10*3/uL (ref 0.0–0.7)
Eosinophils Relative: 2 %
HCT: 50 % (ref 39.0–52.0)
Hemoglobin: 16.8 g/dL (ref 13.0–17.0)
Lymphocytes Relative: 30 %
Lymphs Abs: 3.9 10*3/uL (ref 0.7–4.0)
MCH: 30.9 pg (ref 26.0–34.0)
MCHC: 33.6 g/dL (ref 30.0–36.0)
MCV: 91.9 fL (ref 78.0–100.0)
Monocytes Absolute: 1 10*3/uL (ref 0.1–1.0)
Monocytes Relative: 8 %
Neutro Abs: 7.9 10*3/uL — ABNORMAL HIGH (ref 1.7–7.7)
Neutrophils Relative %: 60 %
Platelets: 263 10*3/uL (ref 150–400)
RBC: 5.44 MIL/uL (ref 4.22–5.81)
RDW: 14.3 % (ref 11.5–15.5)
WBC: 13 10*3/uL — ABNORMAL HIGH (ref 4.0–10.5)

## 2016-03-04 LAB — BASIC METABOLIC PANEL
Anion gap: 8 (ref 5–15)
BUN: 6 mg/dL (ref 6–20)
CO2: 25 mmol/L (ref 22–32)
Calcium: 9.4 mg/dL (ref 8.9–10.3)
Chloride: 105 mmol/L (ref 101–111)
Creatinine, Ser: 0.93 mg/dL (ref 0.61–1.24)
GFR calc Af Amer: >60 mL/min (ref >60)
GFR calc non Af Amer: >60 mL/min (ref >60)
Glucose, Bld: 126 mg/dL — ABNORMAL HIGH (ref 65–99)
Potassium: 4.3 mmol/L (ref 3.5–5.1)
Sodium: 138 mmol/L (ref 135–145)

## 2016-03-04 LAB — TROPONIN I: Troponin I: <0.03 ng/mL (ref <0.03)

## 2016-03-04 MED ORDER — ASPIRIN 81 MG PO CHEW
324.0000 mg | CHEWABLE_TABLET | Freq: Once | ORAL | Status: AC
  Administered 2016-03-04: 324 mg via ORAL
  Filled 2016-03-04: qty 4

## 2016-03-04 NOTE — ED Provider Notes (Signed)
[facility]-EM[facility]Y DEPT Provider Note   CSN: [id number] Arrival date & time: 03/04/16  1507  First Provider Contact:   First MD Initiated Contact with Patient 03/04/16 2020     By signing my name below, I, Rosario Adie, attest that this documentation has been prepared under the direction and in the presence of Raeford Razor, MD. Electronically Signed: Rosario Adie, ED Scribe. 03/04/16. 8:28 PM.  History   Chief Complaint Chief Complaint  Patient presents with  . Chest Pain   The history is provided by the patient. No language interpreter was used.   HPI Comments: Jason Barry is a 60 y.o. male with a PMHx significant of CAD, CHF, HLD, and STEMI, who presents to the Emergency Department complaining of sudden onset, unchanged, constant, non-radiating, pressure-like central chest pain x ~18 hours. Reports associated mild SOB and diaphoresis since the onset of his pain. He notes that he was laying in his bed at the onset of his pain. Pt took 1 dose of Nitroglycerin, and two 81mg  Asprin prior to coming into the ED with minimal relief. His pain is exacerbated with palpation. Pt has a hx of two stent placements, most recent ~1 month ago, and states that he was experiencing similar sx at the time of placement. He reports that he has experienced intermittent, but similar pain even s/p stent placement. Pt is followed by a Development worker, international aid at [facility]. Denies nausea, vomiting, abdominal pain, or any other associated symptoms.   Past Medical History:  Diagnosis Date  . Abnormal LFTs   . CAD (coronary artery disease) 07/06/2015   a. inf STEMI 12/16: 99% dRCA with 100% rPAV, long-tubular ~70% pLAD & mid D1 - PCI of dRCA-rPDA but unable to revascularize rPAV-RPL. LAD treated medically for now, considering further OP workup.// b. LHC 5/17: oLAD 70 (FFR 0.82 >> med Rx), dLAD 95, oLCx 40, pRCA 35, dRCA stent patent, RPAVB 100, EF 25-35% >> PCI: DES to dLAD  . Chronic systolic CHF (congestive  heart failure) (HCC)   . Hyperlipidemia   . Ischemic cardiomyopathy    a. Echo 07/07/15: EF 30%, Inf Akinesis wtih Global HK, normal valves  . Polycythemia   . ST elevation myocardial infarction involving right coronary artery (HCC) 07/05/2015  . Tobacco abuse    Patient Active Problem List   Diagnosis Date Noted  . CAD (coronary artery disease), native coronary artery 01/03/2016  . Abnormal LFTs 07/08/2015  . Polycythemia 07/08/2015  . Chronic systolic CHF (congestive heart failure) (HCC) 07/08/2015  . Tobacco abuse 07/08/2015  . Cardiomyopathy, ischemic; EF 30% (out of proportion to CAD) 07/07/2015  . Hyperlipidemia with target LDL less than 70; Poorly controlled 07/07/2015  . Presence of drug coated stent in right coronary artery 07/06/2015  . History of ST elevation myocardial infarction (STEMI)    Past Surgical History:  Procedure Laterality Date  . APPENDECTOMY    . CARDIAC CATHETERIZATION N/A 07/05/2015   Procedure: Left Heart Cath and Coronary Angiography;  Surgeon: Lennette Bihari, MD;  Location[facility] CV LAB;  Service: Cardiovascular;  dRCA-rPDA 99%, 100% rPAV-PL (PCI of dRCA-rPDA), Ost-Prox LAD 70% (also mD1 ~70-80), EF 20%  . CARDIAC CATHETERIZATION N/A 07/05/2015   Procedure: Coronary Stent Intervention;  Surgeon: Lennette Bihari, MD;  Location: [facility] INVASIVE CV LAB;  Service: Cardiovascular;  Xience Alpine DES 3 x 38 (3.25-3.14 taper) dRCA-rPDA; jailed 100% occluded rPAV.  Marland Kitchen CARDIAC CATHETERIZATION N/A 01/02/2016   Procedure: Left Heart Cath and Coronary Angiography;  Surgeon: Theron Arista  M Swaziland, MD;  [facility]: [facility];  Service: Cardiovascular;  Laterality: N/A;  . CARDIAC CATHETERIZATION N/A 01/02/2016   Procedure: Intravascular Pressure Wire/FFR Study;  Surgeon: Peter M Swaziland, MD;  Location: [facility] INVASIVE CV LAB;  Service: Cardiovascular;  Laterality: N/A;  . CARDIAC CATHETERIZATION N/A 01/02/2016   Procedure: Coronary Stent Intervention;  Surgeon: Peter M Swaziland,  MD;  Location: [facility] INVASIVE CV LAB;  Service: Cardiovascular;  Laterality: N/A;  . TRANSTHORACIC ECHOCARDIOGRAM  07/07/2015   Normal LV size with EF 30%. Diffuse hypokinesis with inferior akinesis. Normal RV size and systolic function. No significant valvular abnormalities.   Home Medications    Prior to Admission medications   Medication Sig Start Date End Date Taking? Authorizing Provider  aspirin EC 81 MG EC tablet Take 1 tablet (81 mg total) by mouth daily. 07/08/15   Dayna N Dunn, PA-C  atorvastatin (LIPITOR) 80 MG tablet Take 1 tablet (80 mg total) by mouth every evening. 07/19/15   Brittainy Sherlynn Carbon, PA-C  carvedilol (COREG) 3.125 MG tablet Take 1 tablet (3.125 mg total) by mouth 2 (two) times daily with a meal. 07/19/15   Brittainy Sherlynn Carbon, PA-C  furosemide (LASIX) 20 MG tablet Take 20 mg by mouth 2 (two) times daily.    Historical Provider, MD  lisinopril (PRINIVIL,ZESTRIL) 2.5 MG tablet Take 1 tablet (2.5 mg total) by mouth daily. 01/03/16   Brittainy Sherlynn Carbon, PA-C  nitroGLYCERIN (NITROSTAT) 0.4 MG SL tablet Place 1 tablet (0.4 mg total) under the tongue every 5 (five) minutes as needed for chest pain (up to 3 doses). 07/19/15   Brittainy Sherlynn Carbon, PA-C  omeprazole (PRILOSEC) 20 MG capsule Take 40 mg by mouth daily.    Historical Provider, MD  oxymetazoline (AFRIN NASAL SPRAY) 0.05 % nasal spray Place 1 spray into both nostrils 2 (two) times daily as needed (nose bleeding for maximum of 3 days). 01/05/16   Lyndal Pulley, MD  ticagrelor (BRILINTA) 90 MG TABS tablet Take 1 tablet (90 mg total) by mouth 2 (two) times daily. 07/19/15   Brittainy Sherlynn Carbon, PA-C   Family History Family History  Problem Relation Age of Onset  . Heart attack Neg Hx   . Hypertension Neg Hx   . Stroke Neg Hx    Social History Social History  Substance Use Topics  . Smoking status: Former Smoker    Quit date: 09/06/2015  . Smokeless tobacco: Not on file  . Alcohol use No   Allergies   Review of  patient's allergies indicates no known allergies.  Review of Systems Review of Systems  Constitutional: Positive for diaphoresis.  Respiratory: Positive for shortness of breath.   Cardiovascular: Positive for chest pain.  Gastrointestinal: Negative for nausea and vomiting.  All other systems reviewed and are negative.  Physical Exam Updated Vital Signs BP 132/81 (BP Location: Right Arm)   Pulse 61   Temp 97.5 F (36.4 C) (Oral)   Resp 20   Ht 5\' 2"  (1.575 m)   Wt 120 lb (54.4 kg)   SpO2 97%   BMI 21.95 kg/m   Physical Exam  Constitutional: He appears well-developed and well-nourished. No distress.  HENT:  Head: Normocephalic and atraumatic.  Mouth/Throat: Oropharynx is clear and moist. No oropharyngeal exudate.  Eyes: Conjunctivae and EOM are normal. Pupils are equal, round, and reactive to light. Right eye exhibits no discharge. Left eye exhibits no discharge. No scleral icterus.  Neck: Normal range of motion. Neck supple. No JVD present. No  thyromegaly present.  Cardiovascular: Normal rate, regular rhythm, normal heart sounds and intact distal pulses.  Exam reveals no gallop and no friction rub.   No murmur heard. Pulmonary/Chest: Effort normal and breath sounds normal. No respiratory distress. He has no wheezes. He has no rales. He exhibits tenderness.  Mild sternal tenderness to palpation.  Abdominal: Soft. Bowel sounds are normal. He exhibits no distension and no mass. There is no tenderness.  Musculoskeletal: Normal range of motion. He exhibits no edema or tenderness.  Lymphadenopathy:    He has no cervical adenopathy.  Neurological: He is alert. Coordination normal.  Skin: Skin is warm and dry. No rash noted. No erythema.  Psychiatric: He has a normal mood and affect. His behavior is normal.  Nursing note and vitals reviewed.  ED Treatments / Results  DIAGNOSTIC STUDIES: Oxygen Saturation is 97% on RA, normal by my interpretation.   COORDINATION OF CARE: 8:26  PM-Discussed next steps with pt including CBC, BMP, Troponin, and EKG. Pt verbalized understanding and is agreeable with the plan.   Labs (all labs ordered are listed, but only abnormal results are displayed) Labs Reviewed  CBC WITH DIFFERENTIAL/PLATELET - Abnormal; Notable for the following:       Result Value   WBC 13.0 (*)    Neutro Abs 7.9 (*)    All other components within normal limits  BASIC METABOLIC PANEL - Abnormal; Notable for the following:    Glucose, Bld 126 (*)    All other components within normal limits  TROPONIN I    EKG  EKG Interpretation  Date/Time:  Monday March 04 2016 15:21:26 EDT Ventricular Rate:  94 PR Interval:  112 QRS Duration: 88 QT Interval:  376 QTC Calculation: 470 R Axis:   -76 Text Interpretation:  Normal sinus rhythm Left axis deviation Inferior infarct , age undetermined Anterior infarct , age undetermined Abnormal ECG Confirmed by Juleen China  MD, Merritt Kibby ([id number]) on 03/04/2016 8:21:21 PM       Radiology No results found.   Dg Chest 2 View  Result Date: 03/04/2016 CLINICAL DATA:  60 year old male with mid chest pain since 0200 hours. Nonproductive cough shortness of breath and fatigue. Smoker. Initial encounter. EXAM: CHEST  2 VIEW COMPARISON:  Chest radiographs 01/01/2016 and earlier. FINDINGS: Mildly lower lung volumes. Mediastinal contours remain normal. Visualized tracheal air column is within normal limits. No pneumothorax, pulmonary edema, pleural effusion or acute pulmonary opacity. Mild scoliotic curvature of the spine. No acute osseous abnormality identified. IMPRESSION: No acute cardiopulmonary abnormality. Electronically Signed   By: Odessa Fleming M.D.   On: 03/04/2016 21:55   Procedures Procedures (including critical care time)  Medications Ordered in ED Medications  aspirin chewable tablet 324 mg (not administered)   Initial Impression / Assessment and Plan / ED Course  I have reviewed the triage vital signs and the nursing  notes.  Pertinent labs & imaging results that were available during my care of the patient were reviewed by me and considered in my medical decision making (see chart for details).  Clinical Course    60 year old male with chest pain. He does have known CAD with recent intervention. That being said, I doubt that this is ACS. Normal troponin more than 12 hours after the onset of symptoms. Reproducible with palpation of the sternum and certain movements. Musculoskeletal? Doubt PE, dissection or other emergent etiology. At this point, I feel is appropriate for discharge. I think it would be prudent for him to follow back up with his  cardiologist to discuss the symptoms further. Emergent return precautions in the interim were detailed.  Final Clinical Impressions(s) / ED Diagnoses   Final diagnoses:  Chest pain, unspecified chest pain type  Nonspecific chest pain    New Prescriptions New Prescriptions   No medications on file   I personally preformed the services scribed in my presence. The recorded information has been reviewed is accurate. Raeford Razor, MD.     Raeford Razor, MD 03/11/16 [phone]

## 2016-03-04 NOTE — ED Triage Notes (Signed)
Pt with c/o chest pain started last night at 2am , has hx of 2 stents with most recent in June.

## 2016-04-17 ENCOUNTER — Ambulatory Visit: Payer: Self-pay | Admitting: Cardiovascular Disease

## 2016-11-23 IMAGING — NM NM MYOCAR MULTI W/ SPECT
3 series · 18 of 18 positions shown · non-contrast
Comparison: none

[Series 1: stress_(id)_sa · 6.5mm · 6.51mm/px · 6 of 64 frames shown (1 of 2)]
[frame 6/64]
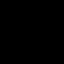
[frame 16/64]
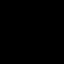
[frame 27/64]
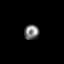
[frame 38/64]
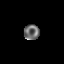
[frame 48/64]
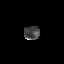
[frame 59/64]
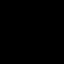

[Series 1: rest-mc_(id)_sa · 6.5mm · 6.51mm/px · 6 of 64 frames shown]
[frame 6/64]
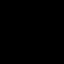
[frame 16/64]
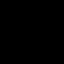
[frame 27/64]
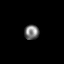
[frame 38/64]
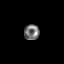
[frame 48/64]
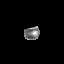
[frame 59/64]
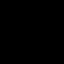

[Series 1: stress_(id)_sa · 6.5mm · 6.51mm/px · 6 of 512 frames shown (2 of 2)]
[frame 43/512]
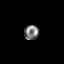
[frame 128/512]
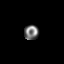
[frame 214/512]
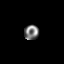
[frame 299/512]
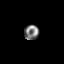
[frame 384/512]
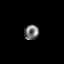
[frame 470/512]
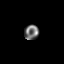

[18 of 18 positions shown; findings below may reference images not displayed]

Canned report from images found in remote index.

Refer to host system for actual result text.

## 2017-04-24 IMAGING — CR DG CHEST 2V
2 series · 2 of 2 positions shown · non-contrast
Comparison: 07/05/2015

CLINICAL DATA: Chest pain

EXAM:
CHEST  2 VIEW

[chest pa]
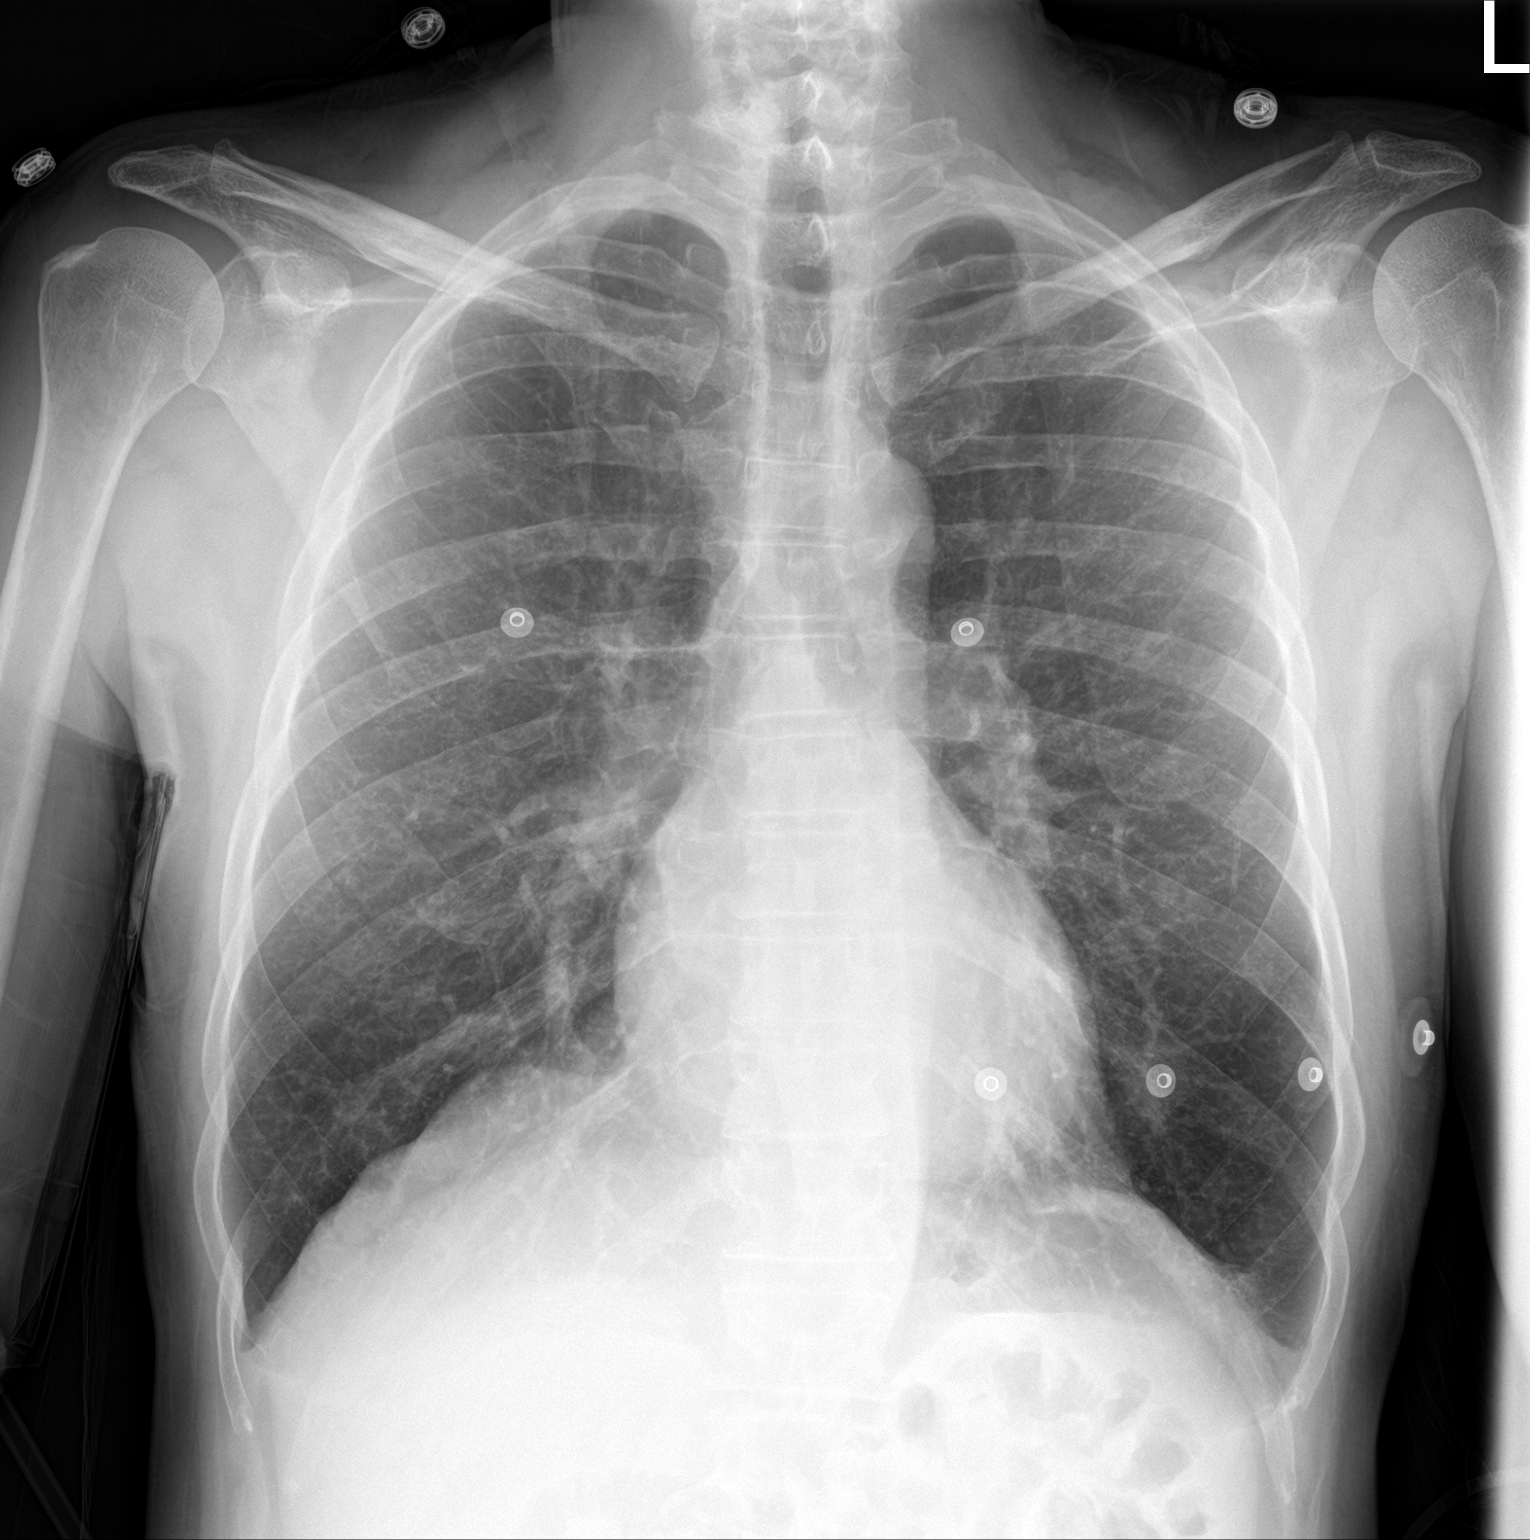

[chest lat]
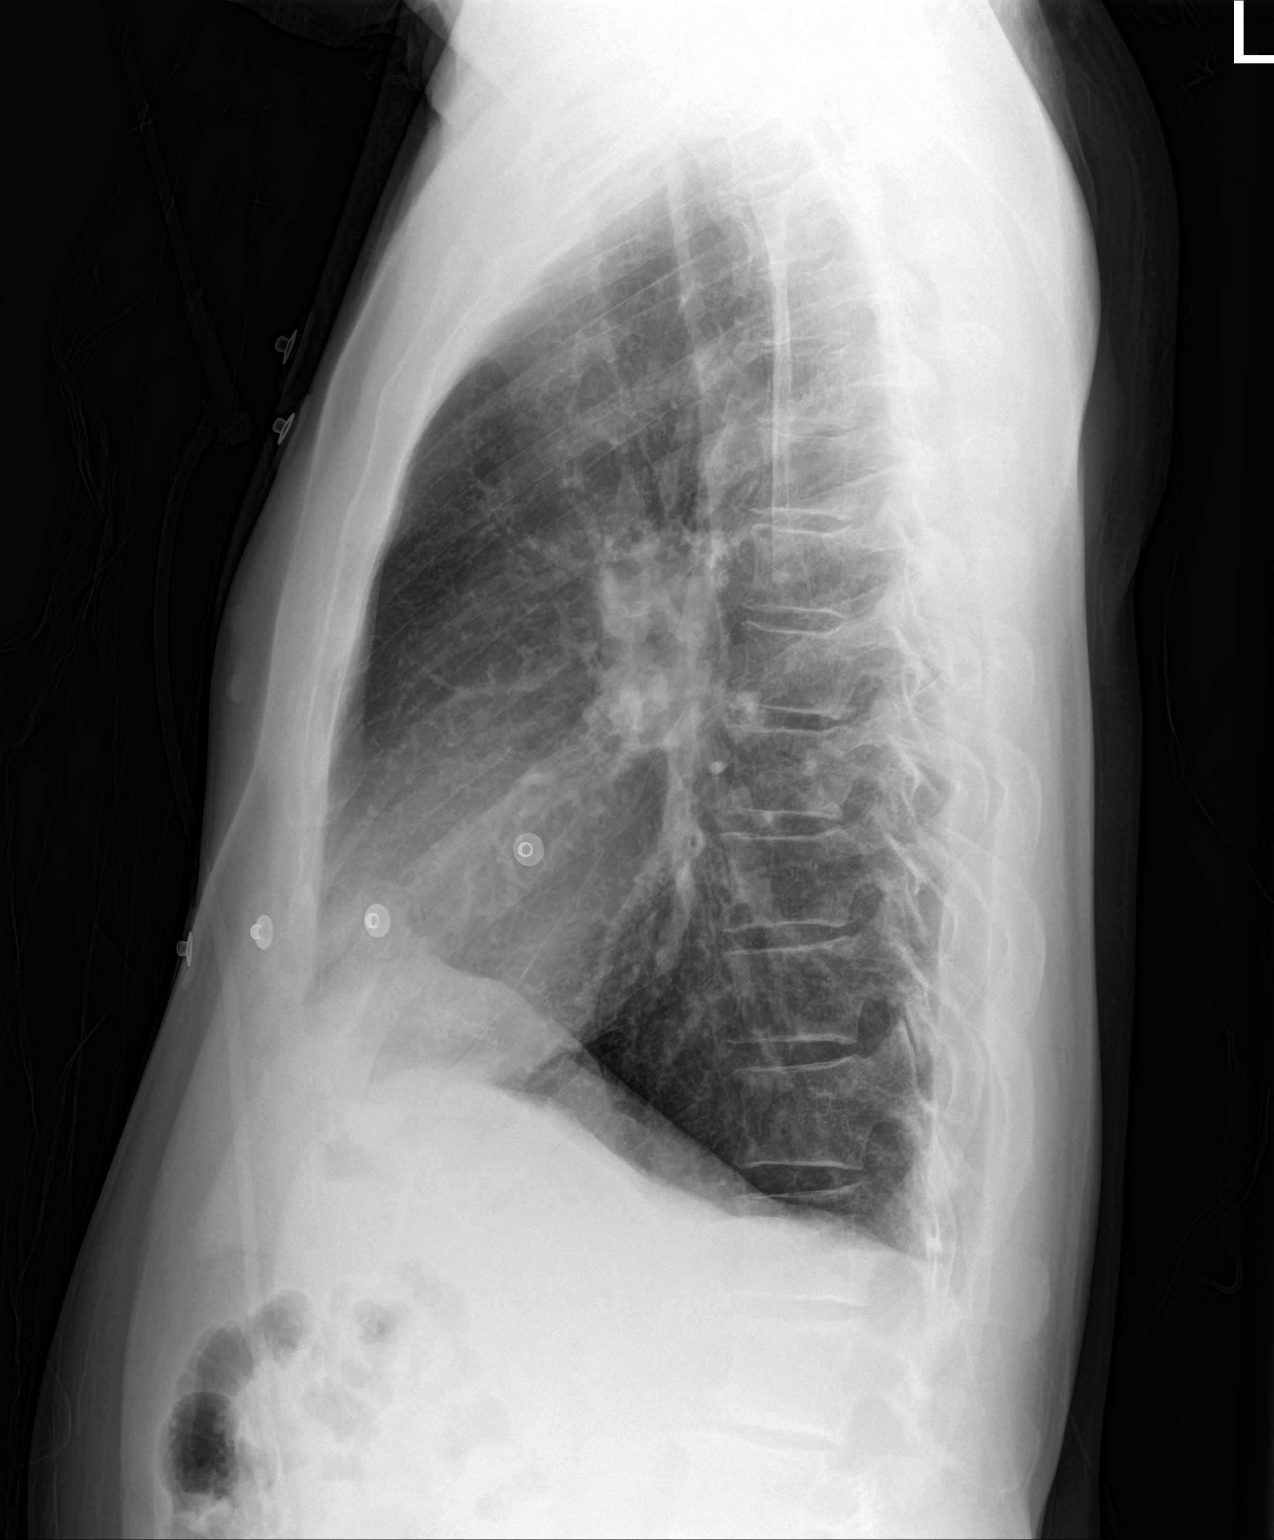

[2 of 2 positions shown; findings below may reference images not displayed]

FINDINGS: The heart size and mediastinal contours are within normal limits.
Both lungs are clear. The visualized skeletal structures are
unremarkable.
IMPRESSION: No active cardiopulmonary disease.

## 2017-10-13 ENCOUNTER — Emergency Department (HOSPITAL_COMMUNITY): Payer: Self-pay

## 2017-10-13 ENCOUNTER — Encounter (HOSPITAL_COMMUNITY): Payer: Self-pay | Admitting: Emergency Medicine

## 2017-10-13 DIAGNOSIS — I5022 Chronic systolic (congestive) heart failure: Secondary | ICD-10-CM | POA: Insufficient documentation

## 2017-10-13 DIAGNOSIS — I2510 Atherosclerotic heart disease of native coronary artery without angina pectoris: Secondary | ICD-10-CM | POA: Insufficient documentation

## 2017-10-13 DIAGNOSIS — Z79899 Other long term (current) drug therapy: Secondary | ICD-10-CM | POA: Insufficient documentation

## 2017-10-13 DIAGNOSIS — Z7982 Long term (current) use of aspirin: Secondary | ICD-10-CM | POA: Insufficient documentation

## 2017-10-13 DIAGNOSIS — J209 Acute bronchitis, unspecified: Secondary | ICD-10-CM | POA: Insufficient documentation

## 2017-10-13 DIAGNOSIS — R0789 Other chest pain: Secondary | ICD-10-CM | POA: Insufficient documentation

## 2017-10-13 DIAGNOSIS — Z87891 Personal history of nicotine dependence: Secondary | ICD-10-CM | POA: Insufficient documentation

## 2017-10-13 LAB — BASIC METABOLIC PANEL
ANION GAP: 9 (ref 5–15)
BUN: 9 mg/dL (ref 6–20)
CO2: 26 mmol/L (ref 22–32)
Calcium: 9.1 mg/dL (ref 8.9–10.3)
Chloride: 102 mmol/L (ref 101–111)
Creatinine, Ser: 1.25 mg/dL — ABNORMAL HIGH (ref 0.61–1.24)
GFR, EST NON AFRICAN AMERICAN: 60 mL/min — AB (ref >60)
Glucose, Bld: 121 mg/dL — ABNORMAL HIGH (ref 65–99)
Potassium: 3.9 mmol/L (ref 3.5–5.1)
SODIUM: 137 mmol/L (ref 135–145)

## 2017-10-13 LAB — CBC
HCT: 46.8 % (ref 39.0–52.0)
Hemoglobin: 15.9 g/dL (ref 13.0–17.0)
MCH: 32.7 pg (ref 26.0–34.0)
MCHC: 34 g/dL (ref 30.0–36.0)
MCV: 96.3 fL (ref 78.0–100.0)
Platelets: 299 10*3/uL (ref 150–400)
RBC: 4.86 MIL/uL (ref 4.22–5.81)
RDW: 14.2 % (ref 11.5–15.5)
WBC: 14.6 10*3/uL — AB (ref 4.0–10.5)

## 2017-10-13 LAB — I-STAT TROPONIN, ED: Troponin i, poc: 0.01 ng/mL (ref 0.00–0.08)

## 2017-10-13 NOTE — ED Triage Notes (Signed)
Pt reports chest pain since noon today, states nitro SL not effective.

## 2017-10-14 ENCOUNTER — Emergency Department (HOSPITAL_COMMUNITY)
Admission: EM | Admit: 2017-10-14 | Discharge: 2017-10-14 | Disposition: A | Discharge status: Home / Self Care | Payer: Self-pay | Attending: Emergency Medicine | Admitting: Emergency Medicine

## 2017-10-14 DIAGNOSIS — R0789 Other chest pain: Secondary | ICD-10-CM

## 2017-10-14 DIAGNOSIS — J40 Bronchitis, not specified as acute or chronic: Secondary | ICD-10-CM

## 2017-10-14 LAB — I-STAT TROPONIN, ED: Troponin i, poc: 0.01 ng/mL (ref 0.00–0.08)

## 2017-10-14 MED ORDER — ALBUTEROL SULFATE HFA 108 (90 BASE) MCG/ACT IN AERS
2.0000 | INHALATION_SPRAY | Freq: Once | RESPIRATORY_TRACT | Status: AC
  Administered 2017-10-14: 2 via RESPIRATORY_TRACT
  Filled 2017-10-14: qty 6.7

## 2017-10-14 MED ORDER — DOXYCYCLINE HYCLATE 100 MG PO CAPS: 100.0000 mg | ORAL_CAPSULE | Freq: Two times a day (BID) | ORAL | 0 refills | Status: AC

## 2017-10-14 MED ORDER — PREDNISONE 20 MG PO TABS
60.0000 mg | ORAL_TABLET | Freq: Once | ORAL | Status: AC
Start: 2017-10-14 — End: 2017-10-14
  Administered 2017-10-14: 60 mg via ORAL
  Filled 2017-10-14: qty 3

## 2017-10-14 MED ORDER — HYDROCODONE-ACETAMINOPHEN 5-325 MG PO TABS
1.0000 | ORAL_TABLET | Freq: Once | ORAL | Status: DC
  Filled 2017-10-14: qty 1

## 2017-10-14 MED ORDER — PREDNISONE 20 MG PO TABS: 40.0000 mg | ORAL_TABLET | Freq: Every day | ORAL | 0 refills | Status: AC

## 2017-10-14 NOTE — ED Provider Notes (Signed)
10:20 AMI  I was asked by the charge nurse to see the patient for reevaluation P/T dc for concern about breathing and hypoxia. Patient with likely underlying COPD/ daily smoker and dx of atypical chest pain by Dr. Wilkie AyeHorton. He received a Duoneb p/t my evaluation a had no wheezing on evaluation.  He has normal effort and air movement.  He is currently chest pain-free patient was able to ambulate in the emergency department without any drop in his oxygen saturations.  Patient appears appropriate for discharge at this time   Arthor CaptainHarris, Satonya Lux, Cordelia Poche-C 10/14/17 1155    Jacalyn LefevreHaviland, Julie, MD 10/14/17 1456

## 2017-10-14 NOTE — ED Provider Notes (Signed)
[facility] EMERGENCY DEPARTMENT Provider Note   CSN: [id number] Arrival date & time: 10/13/17  2223     History   Chief Complaint Chief Complaint  Patient presents with  . Chest Pain    HPI Jason Barry is a 62 y.o. male.  HPI  This a 62 year old male with a history of coronary artery disease, heart failure, hyperlipidemia, tobacco abuse who presents with chest pain.  Onset of chest pain yesterday afternoon.  He reports that his anterior and nonradiating.  Denies any shortness of breath.  Denies any recent fevers or cough.  Unclear whether this is similar to his prior MIs.  Currently rates his pain 8 out of 10.  He took a nitroglycerin without relief.  Denies any recent leg swelling or history of blood clots.   Chart reviewed.  Last cardiac catheterization in May 2017.  At that time he had 2 vessel obstructive disease and severe LV dysfunction.  Successful stenting of an LAD with drug-eluting stent.  Otherwise medical management was recommended.    Past Medical History:  Diagnosis Date  . Abnormal LFTs   . CAD (coronary artery disease) 07/06/2015   a. inf STEMI 12/16: 99% dRCA with 100% rPAV, long-tubular ~70% pLAD & mid D1 - PCI of dRCA-rPDA but unable to revascularize rPAV-RPL. LAD treated medically for now, considering further OP workup.// b. LHC 5/17: oLAD 70 (FFR 0.82 >> med Rx), dLAD 95, oLCx 40, pRCA 35, dRCA stent patent, RPAVB 100, EF 25-35% >> PCI: DES to dLAD  . Chronic systolic CHF (congestive heart failure) (HCC)   . Hyperlipidemia   . Ischemic cardiomyopathy    a. Echo 07/07/15: EF 30%, Inf Akinesis wtih Global HK, normal valves  . Polycythemia   . ST elevation myocardial infarction involving right coronary artery (HCC) 07/05/2015  . Tobacco abuse     Patient Active Problem List   Diagnosis Date Noted  . CAD (coronary artery disease), native coronary artery 01/03/2016  . Abnormal LFTs 07/08/2015  . Polycythemia 07/08/2015  . Chronic  systolic CHF (congestive heart failure) (HCC) 07/08/2015  . Tobacco abuse 07/08/2015  . Cardiomyopathy, ischemic; EF 30% (out of proportion to CAD) 07/07/2015  . Hyperlipidemia with target LDL less than 70; Poorly controlled 07/07/2015  . Presence of drug coated stent in right coronary artery 07/06/2015  . History of ST elevation myocardial infarction (STEMI)     Past Surgical History:  Procedure Laterality Date  . APPENDECTOMY    . CARDIAC CATHETERIZATION N/A 07/05/2015   Procedure: Left Heart Cath and Coronary Angiography;  Surgeon: Lennette Bihari, MD;  Location: [facility] INVASIVE CV LAB;  Service: Cardiovascular;  dRCA-rPDA 99%, 100% rPAV-PL (PCI of dRCA-rPDA), Ost-Prox LAD 70% (also mD1 ~70-80), EF 20%  . CARDIAC CATHETERIZATION N/A 07/05/2015   Procedure: Coronary Stent Intervention;  Surgeon: Lennette Bihari, MD;  Location: [facility] INVASIVE CV LAB;  Service: Cardiovascular;  Xience Alpine DES 3 x 38 (3.25-3.14 taper) dRCA-rPDA; jailed 100% occluded rPAV.  Marland Kitchen CARDIAC CATHETERIZATION N/A 01/02/2016   Procedure: Left Heart Cath and Coronary Angiography;  Surgeon: Peter M Swaziland, MD;  Location: [facility] INVASIVE CV LAB;  Service: Cardiovascular;  Laterality: N/A;  . CARDIAC CATHETERIZATION N/A 01/02/2016   Procedure: Intravascular Pressure Wire/FFR Study;  Surgeon: Peter M Swaziland, MD;  Location: [facility] INVASIVE CV LAB;  Service: Cardiovascular;  Laterality: N/A;  . CARDIAC CATHETERIZATION N/A 01/02/2016   Procedure: Coronary Stent Intervention;  Surgeon: Peter M Swaziland, MD;  Location: [facility] INVASIVE CV LAB;  Service: Cardiovascular;  Laterality: N/A;  . TRANSTHORACIC ECHOCARDIOGRAM  07/07/2015   Normal LV size with EF 30%. Diffuse hypokinesis with inferior akinesis. Normal RV size and systolic function. No significant valvular abnormalities.       Home Medications    Prior to Admission medications   Medication Sig Start Date End Date Taking? Authorizing Provider  aspirin EC 81 MG EC tablet Take 1 tablet (81 mg  total) by mouth daily. Patient taking differently: Take 324 mg by mouth daily.  07/08/15   Dunn, Tacey Ruiz, PA-C  atorvastatin (LIPITOR) 80 MG tablet Take 1 tablet (80 mg total) by mouth every evening. Patient taking differently: Take 80 mg by mouth at bedtime.  07/19/15   Robbie Lis M, PA-C  carvedilol (COREG) 3.125 MG tablet Take 1 tablet (3.125 mg total) by mouth 2 (two) times daily with a meal. Patient not taking: Reported on 03/04/2016 07/19/15   Robbie Lis M, PA-C  carvedilol (COREG) 6.25 MG tablet Take 3.125 mg by mouth 2 (two) times daily with a meal.    [provider]  doxycycline (VIBRAMYCIN) 100 MG capsule Take 1 capsule (100 mg total) by mouth 2 (two) times daily. 10/14/17   Horton, Mayer Masker, MD  lisinopril (PRINIVIL,ZESTRIL) 2.5 MG tablet Take 1 tablet (2.5 mg total) by mouth daily. 01/03/16   Robbie Lis M, PA-C  nitroGLYCERIN (NITROSTAT) 0.4 MG SL tablet Place 1 tablet (0.4 mg total) under the tongue every 5 (five) minutes as needed for chest pain (up to 3 doses). 07/19/15   Robbie Lis M, PA-C  potassium chloride SA (K-DUR,KLOR-CON) 20 MEQ tablet Take 20 mEq by mouth daily with supper.    [provider]  predniSONE (DELTASONE) 20 MG tablet Take 2 tablets (40 mg total) by mouth daily. 10/14/17   Horton, Mayer Masker, MD  ranolazine (RANEXA) 500 MG 12 hr tablet Take 500 mg by mouth 2 (two) times daily.    [provider]  ticagrelor (BRILINTA) 90 MG TABS tablet Take 1 tablet (90 mg total) by mouth 2 (two) times daily. 07/19/15   Allayne Butcher, PA-C    Family History Family History  Problem Relation Age of Onset  . Heart attack Neg Hx   . Hypertension Neg Hx   . Stroke Neg Hx     Social History Social History   Tobacco Use  . Smoking status: Former Smoker    Last attempt to quit: 09/06/2015    Years since quitting: 2.1  Substance Use Topics  . Alcohol use: No    Alcohol/week: 0.0 oz  . Drug use: No      Allergies   Patient has no known allergies.   Review of Systems Review of Systems  Constitutional: Negative for fever.  Respiratory: Negative for shortness of breath.   Cardiovascular: Positive for chest pain. Negative for leg swelling.  Gastrointestinal: Negative for abdominal pain, nausea and vomiting.  Musculoskeletal: Negative for back pain.  Neurological: Negative for headaches.  All other systems reviewed and are negative.    Physical Exam Updated Vital Signs BP 127/73   Pulse 71   Temp 97.7 F (36.5 C) (Oral)   Resp (!) 22   SpO2 92%   Physical Exam  Constitutional: He is oriented to person, place, and time. He appears well-developed and well-nourished. No distress.  Elderly, no acute distress  HENT:  Head: Normocephalic and atraumatic.  Cardiovascular: Normal rate, regular rhythm, normal heart sounds and normal pulses.  No murmur heard. Tenderness to  palpation anterior chest without crepitus  Pulmonary/Chest: Effort normal. No respiratory distress. He has wheezes.  Scant expiratory wheeze  Abdominal: Soft. Bowel sounds are normal. There is no tenderness. There is no rebound.  Musculoskeletal: He exhibits no edema.       Right lower leg: He exhibits no edema.       Left lower leg: He exhibits no edema.  Neurological: He is alert and oriented to person, place, and time.  Skin: Skin is warm and dry.  Psychiatric: He has a normal mood and affect.  Nursing note and vitals reviewed.    ED Treatments / Results  Labs (all labs ordered are listed, but only abnormal results are displayed) Labs Reviewed  BASIC METABOLIC PANEL - Abnormal; Notable for the following components:      Result Value   Glucose, Bld 121 (*)    Creatinine, Ser 1.25 (*)    GFR calc non Af Amer 60 (*)    All other components within normal limits  CBC - Abnormal; Notable for the following components:   WBC 14.6 (*)    All other components within normal limits  I-STAT TROPONIN, ED   I-STAT TROPONIN, ED    EKG  EKG Interpretation  Date/Time:  Monday October 13 2017 22:45:40 EDT Ventricular Rate:  83 PR Interval:  122 QRS Duration: 96 QT Interval:  388 QTC Calculation: 455 R Axis:   123 Text Interpretation:  Sinus rhythm with Premature supraventricular complexes Left posterior fascicular block Anterior infarct , age undetermined Abnormal ECG No significant change since last tracing Confirmed by Ross MarcusHorton, Courtney ([id number]) on 10/14/2017 4:54:01 AM       Radiology Dg Chest 2 View  Result Date: 10/13/2017 CLINICAL DATA:  Acute onset of mid chest pain. EXAM: CHEST - 2 VIEW COMPARISON:  Chest radiograph performed 03/04/2016 FINDINGS: The lungs are well-aerated and clear. There is no evidence of focal opacification, pleural effusion or pneumothorax. The heart is normal in size; the mediastinal contour is within normal limits. No acute osseous abnormalities are seen. IMPRESSION: No acute cardiopulmonary process seen. Electronically Signed   By: Roanna RaiderJeffery  Chang M.D.   On: 10/13/2017 23:26    Procedures Procedures (including critical care time)  Medications Ordered in ED Medications  HYDROcodone-acetaminophen (NORCO/VICODIN) 5-325 MG per tablet 1 tablet (not administered)  albuterol (PROVENTIL HFA;VENTOLIN HFA) 108 (90 Base) MCG/ACT inhaler 2 puff (not administered)  predniSONE (DELTASONE) tablet 60 mg (not administered)     Initial Impression / Assessment and Plan / ED Course  I have reviewed the triage vital signs and the nursing notes.  Pertinent labs & imaging results that were available during my care of the patient were reviewed by me and considered in my medical decision making (see chart for details).     She presents with chest pain.  He is overall nontoxic appearing on exam and vital signs notable for SPO2 of 92%.  He is in no acute distress.  EKG shows no acute ischemic changes.  Initial troponin at 11:23 PM last night negative.  He does have some  reproducible tenderness on exam.  He also has some wheezing.  No history of COPD but he is a daily smoker and continues to smoke.  Will repeat troponin.  Pain is somewhat atypical.  He is high risk and does have known cardiac disease but if repeat troponin is negative, this would be very reassuring and greater than 12 hours after onset of pain.  Patient has a mild leukocytosis.  No  pneumonia on chest x-ray.  Repeat troponin at 6:40 AM is negative.  Given wheezing, will treat with an inhaler and prednisone.  Patient likely has COPD that is undiagnosed.  Will discharge home with inhaler, prednisone, doxycycline for presumed COPD exacerbation.  He will need very close follow-up with his cardiologist as well.  I discussed this with the patient and he stated understanding.  Final Clinical Impressions(s) / ED Diagnoses   Final diagnoses:  Atypical chest pain  Bronchitis    ED Discharge Orders        Ordered    predniSONE (DELTASONE) 20 MG tablet  Daily     10/14/17 0642    doxycycline (VIBRAMYCIN) 100 MG capsule  2 times daily     10/14/17 0643       Horton, Mayer Masker, MD 10/14/17 [phone]

## 2017-10-14 NOTE — Discharge Instructions (Signed)
You were seen today for chest pain.  Your heart workup is reassuring.  You may have an early upper respiratory infection or COPD given your history of smoking.  You will be discharged with an inhaler and prednisone.  Follow-up with your cardiologist as soon as possible given your history of heart disease.

## 2019-02-04 IMAGING — CR DG CHEST 2V
2 series · 2 of 2 positions shown · non-contrast
Comparison: Chest radiograph performed 03/04/2016

CLINICAL DATA: Acute onset of mid chest pain.

EXAM:
CHEST - 2 VIEW

[chest pa]
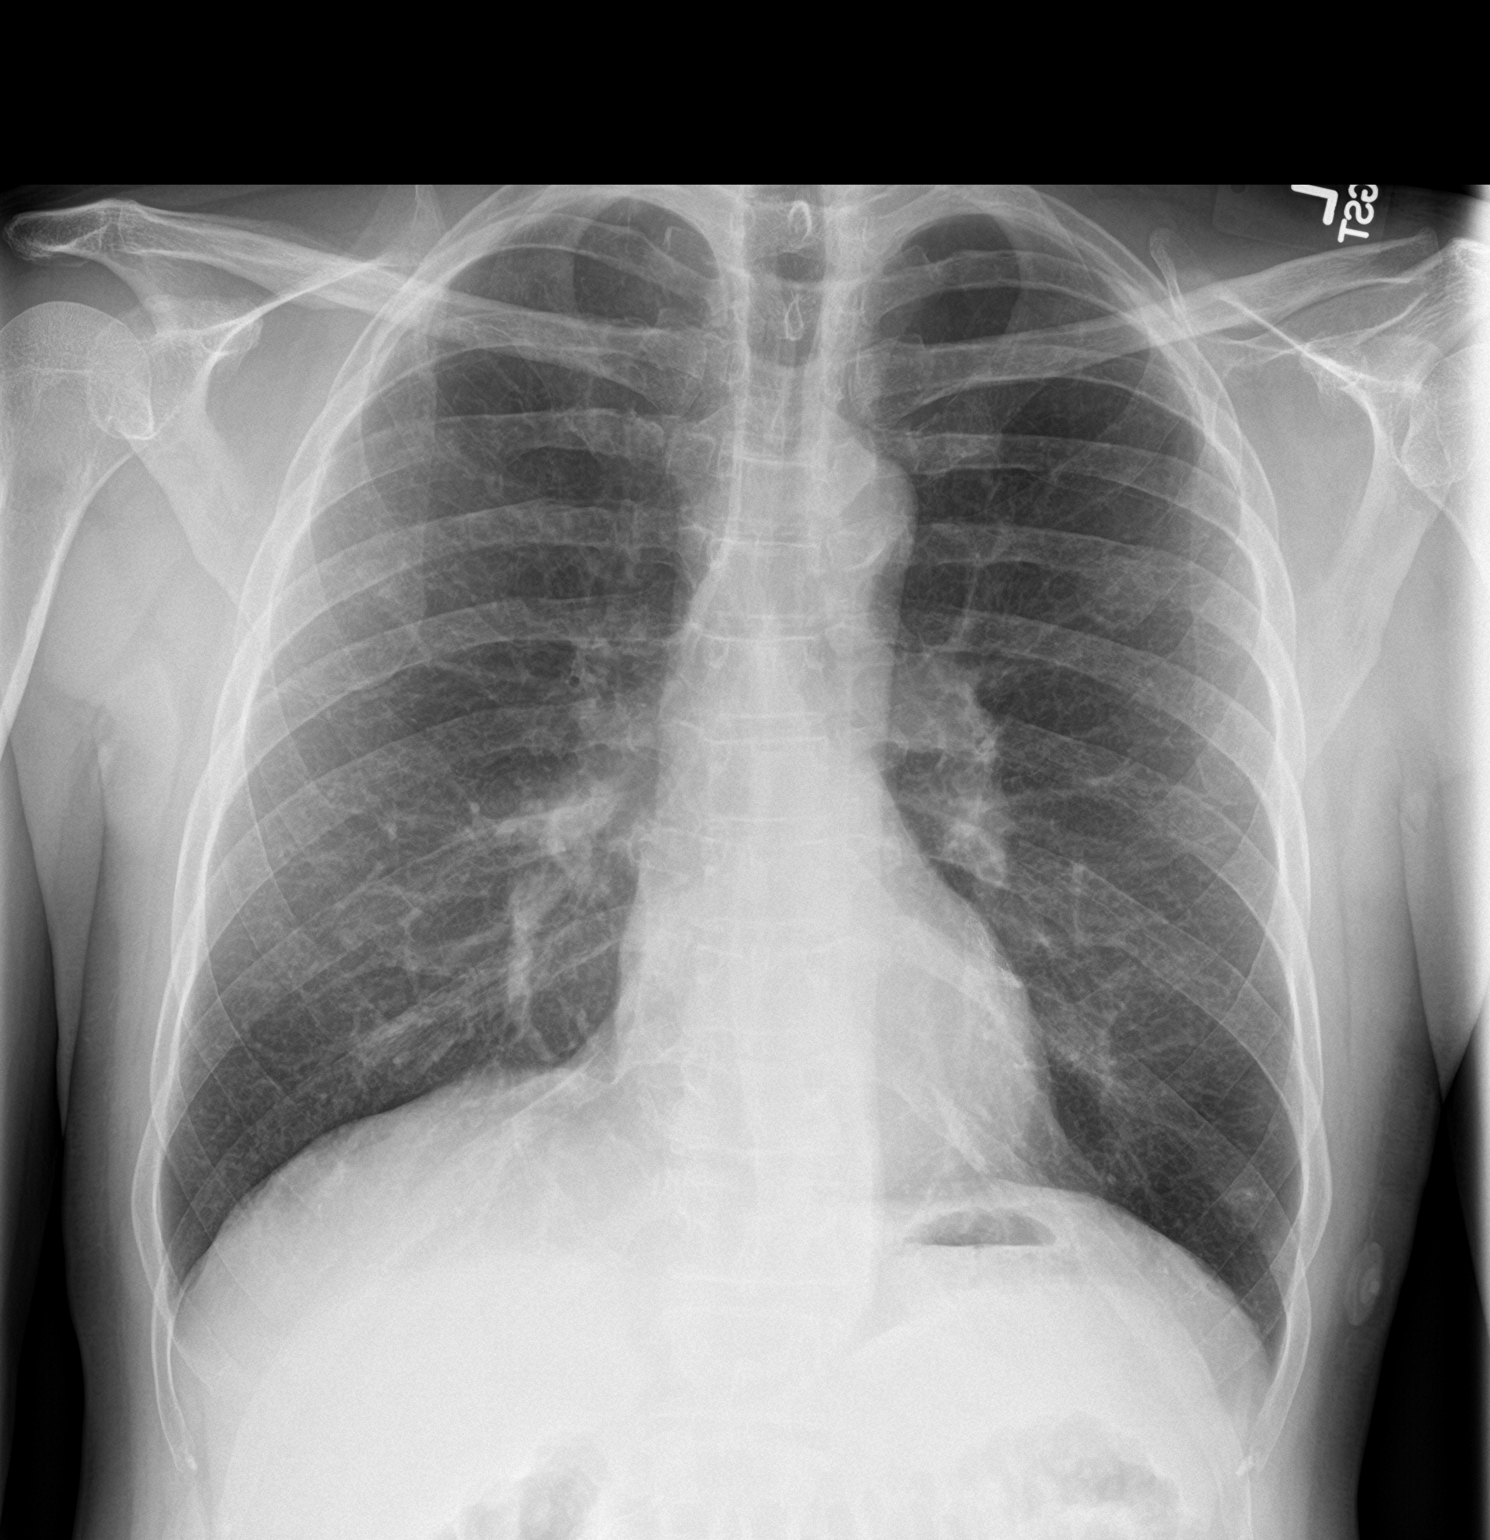

[chest lat]
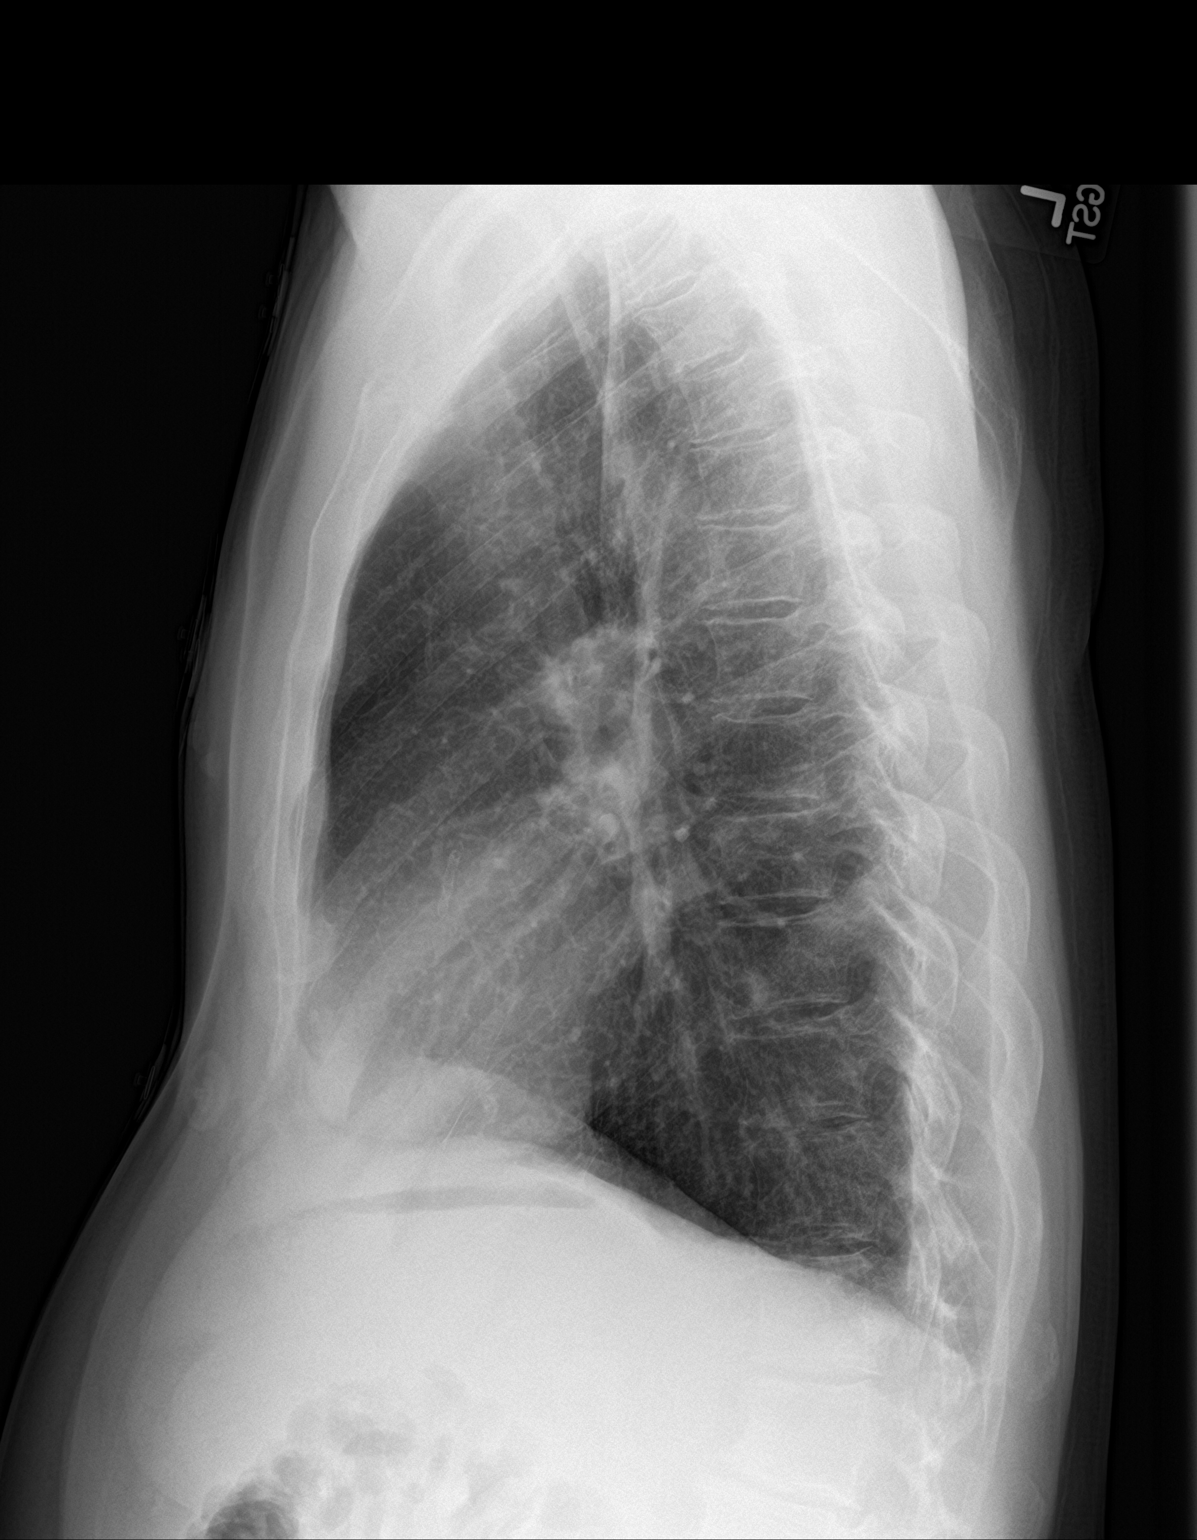

[2 of 2 positions shown; findings below may reference images not displayed]

FINDINGS: The lungs are well-aerated and clear. There is no evidence of focal
opacification, pleural effusion or pneumothorax.

The heart is normal in size; the mediastinal contour is within
normal limits. No acute osseous abnormalities are seen.
IMPRESSION: No acute cardiopulmonary process seen.

## 2024-01-04 DEATH — deceased
